# Patient Record
Sex: Female | Born: 1980 | Race: White | Hispanic: No | Marital: Married | Smoking: Never smoker
Health system: Southern US, Community
[De-identification: ages and names within clinical notes are randomized; demographics above are authoritative.]

## PROBLEM LIST (undated history)

## (undated) DIAGNOSIS — L732 Hidradenitis suppurativa: Secondary | ICD-10-CM

## (undated) DIAGNOSIS — B029 Zoster without complications: Secondary | ICD-10-CM

## (undated) DIAGNOSIS — M419 Scoliosis, unspecified: Secondary | ICD-10-CM

## (undated) DIAGNOSIS — L709 Acne, unspecified: Secondary | ICD-10-CM

## (undated) DIAGNOSIS — Z8619 Personal history of other infectious and parasitic diseases: Secondary | ICD-10-CM

## (undated) HISTORY — DX: Zoster without complications: B02.9

## (undated) HISTORY — PX: MANDIBLE SURGERY: SHX707

## (undated) HISTORY — DX: Hidradenitis suppurativa: L73.2

## (undated) HISTORY — PX: OTHER SURGICAL HISTORY: SHX169

## (undated) HISTORY — PX: ADENOIDECTOMY: SUR15

## (undated) HISTORY — DX: Personal history of other infectious and parasitic diseases: Z86.19

## (undated) HISTORY — PX: TONSILLECTOMY: SUR1361

## (undated) HISTORY — PX: TONSILLECTOMY AND ADENOIDECTOMY: SHX28

---

## 2007-02-08 ENCOUNTER — Emergency Department (HOSPITAL_COMMUNITY): Admission: EM | Admit: 2007-02-08 | Discharge: 2007-02-08 | Payer: Self-pay | Admitting: Emergency Medicine

## 2007-06-11 ENCOUNTER — Other Ambulatory Visit: Admission: RE | Admit: 2007-06-11 | Discharge: 2007-06-11 | Payer: Self-pay | Admitting: Obstetrics and Gynecology

## 2008-04-22 ENCOUNTER — Ambulatory Visit: Payer: Self-pay | Admitting: Sports Medicine

## 2008-04-22 DIAGNOSIS — M2140 Flat foot [pes planus] (acquired), unspecified foot: Secondary | ICD-10-CM | POA: Insufficient documentation

## 2008-04-22 DIAGNOSIS — IMO0002 Reserved for concepts with insufficient information to code with codable children: Secondary | ICD-10-CM | POA: Insufficient documentation

## 2008-04-22 DIAGNOSIS — M4120 Other idiopathic scoliosis, site unspecified: Secondary | ICD-10-CM | POA: Insufficient documentation

## 2008-04-23 ENCOUNTER — Encounter: Payer: Self-pay | Admitting: Sports Medicine

## 2009-01-20 ENCOUNTER — Ambulatory Visit: Payer: Self-pay | Admitting: Sports Medicine

## 2009-01-20 DIAGNOSIS — M659 Synovitis and tenosynovitis, unspecified: Secondary | ICD-10-CM | POA: Insufficient documentation

## 2009-01-20 DIAGNOSIS — M25579 Pain in unspecified ankle and joints of unspecified foot: Secondary | ICD-10-CM | POA: Insufficient documentation

## 2009-04-29 ENCOUNTER — Inpatient Hospital Stay (HOSPITAL_COMMUNITY): Admission: AD | Admit: 2009-04-29 | Discharge: 2009-05-19 | Payer: Self-pay | Admitting: Obstetrics and Gynecology

## 2009-05-01 ENCOUNTER — Encounter: Payer: Self-pay | Admitting: Obstetrics and Gynecology

## 2009-05-15 ENCOUNTER — Encounter (INDEPENDENT_AMBULATORY_CARE_PROVIDER_SITE_OTHER): Payer: Self-pay | Admitting: Obstetrics and Gynecology

## 2009-08-02 ENCOUNTER — Emergency Department (HOSPITAL_COMMUNITY): Admission: EM | Admit: 2009-08-02 | Discharge: 2009-08-02 | Payer: Self-pay | Admitting: Emergency Medicine

## 2010-09-19 IMAGING — US US FETAL BPP W/O NONSTRESS
1 series · 14 of 22 positions shown · non-contrast
Comparison: none

OBSTETRICAL ULTRASOUND:
 This ultrasound exam was performed in the [HOSPITAL] Ultrasound Department.  The OB US report was generated in the AS system, and faxed to the ordering physician.  This report is also available in [HOSPITAL]?s AccessANYware and in [REDACTED] PACS.

[Series 1: us fetal bpp w/o nonstress · non-contrast · 0.27mm/px · 22 acquisitions, 14 frames shown]
[im 1/22]
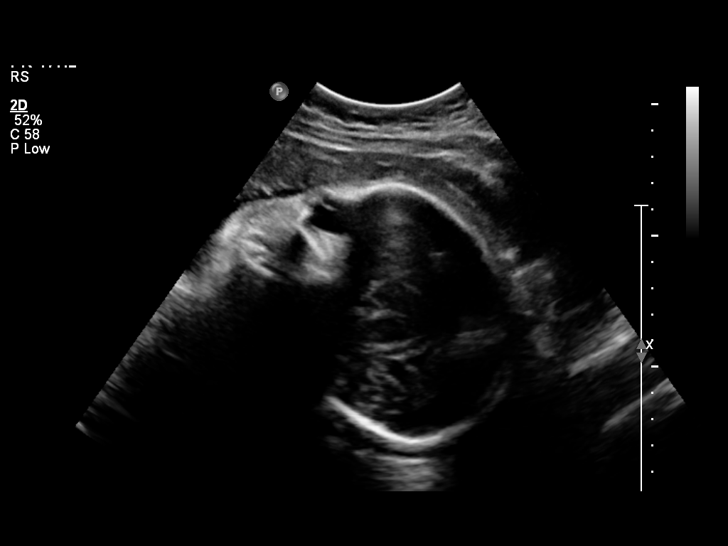
[im 3/22]
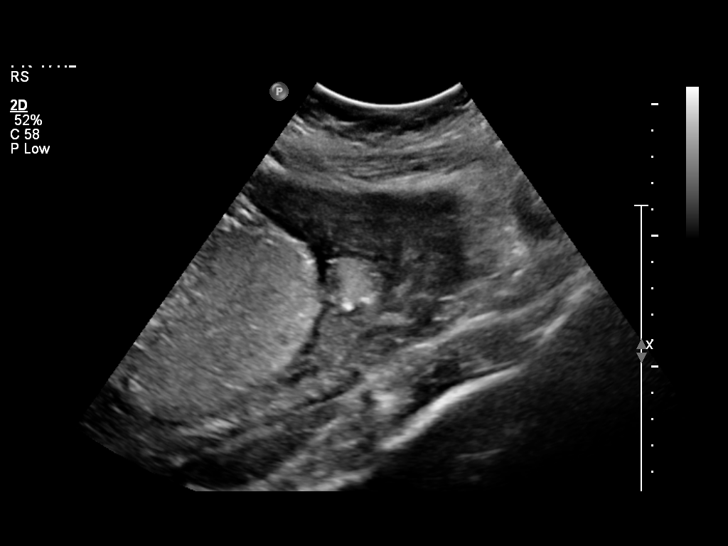
[im 4/22]
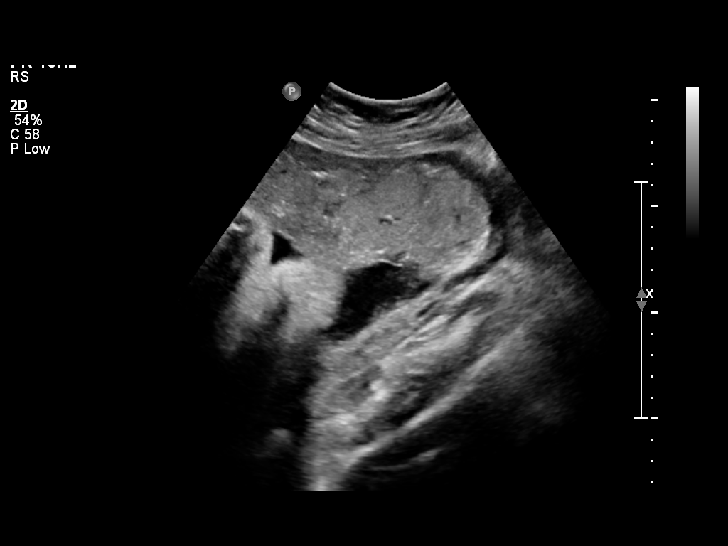
[im 6/22]
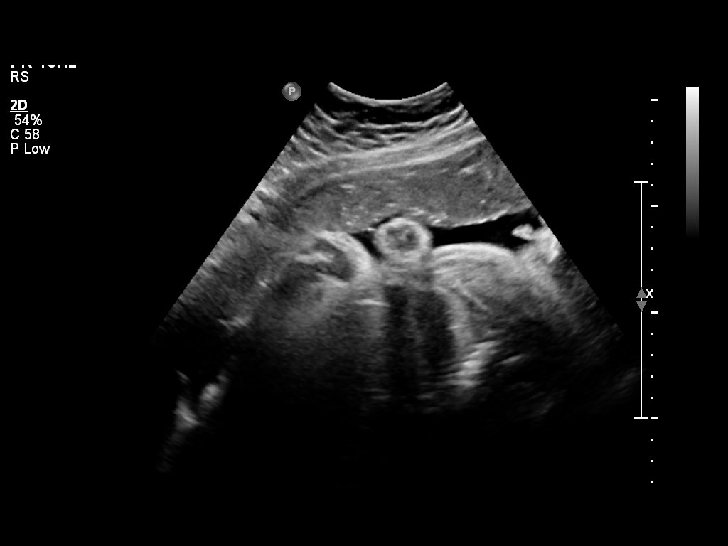
[im 8/22]
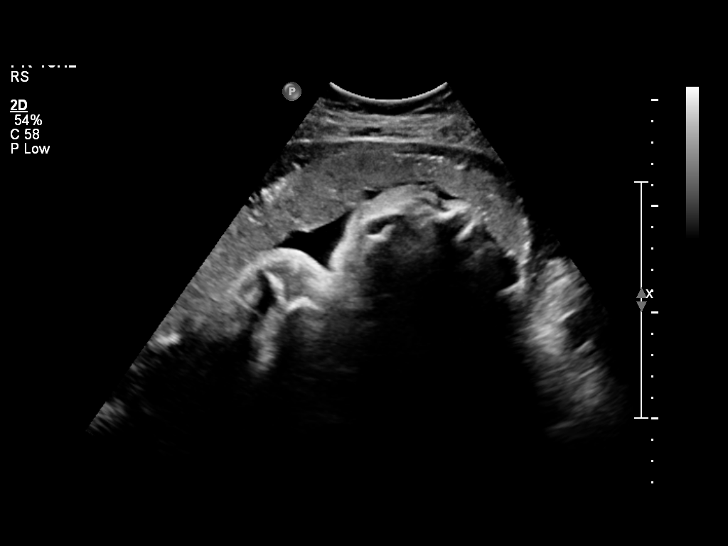
[im 9/22]
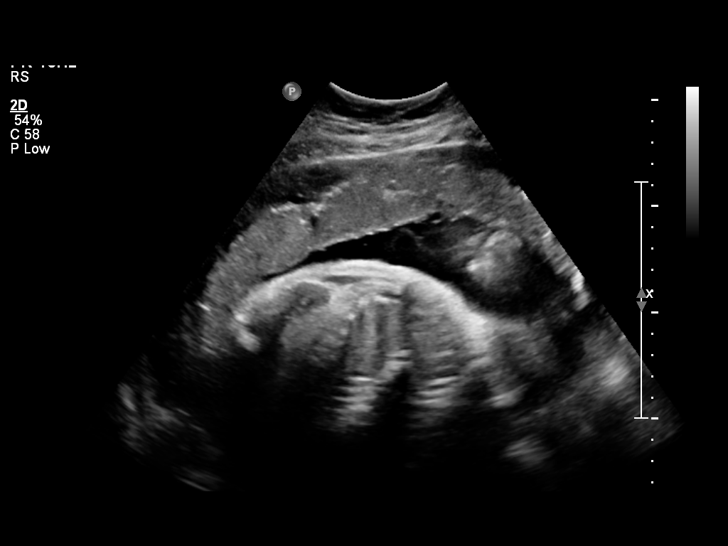
[im 11/22]
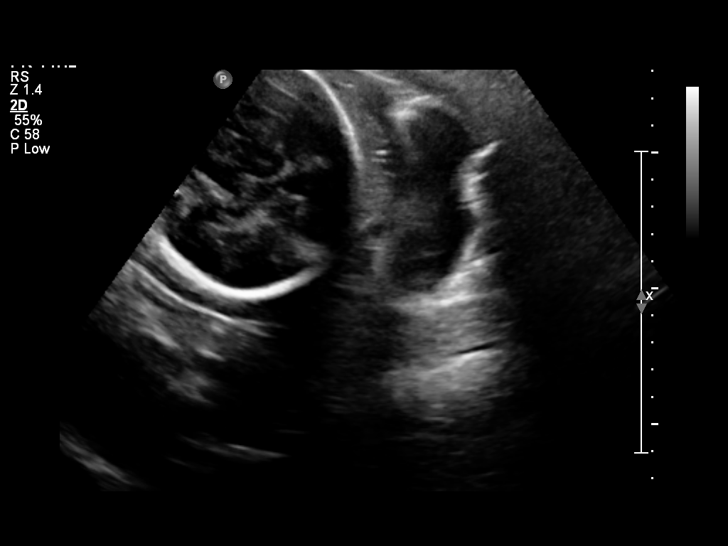
[im 12/22]
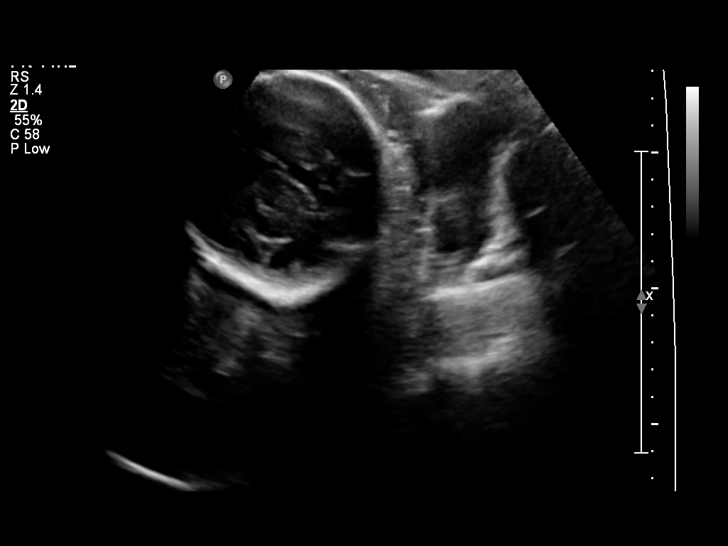
[im 14/22]
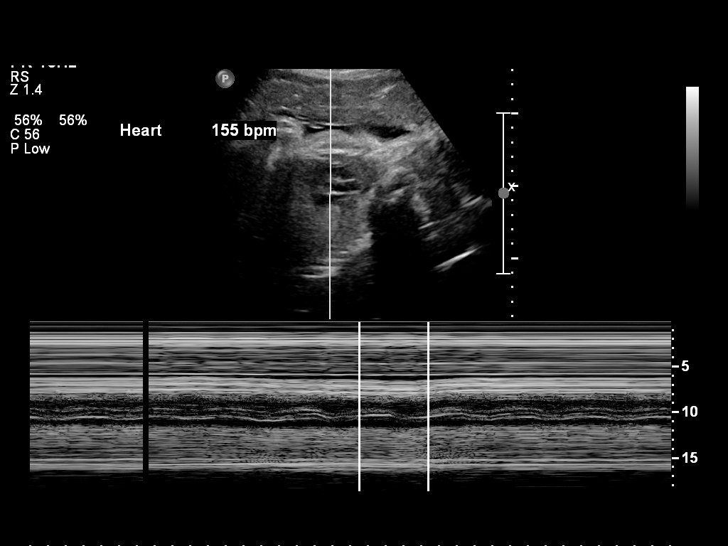
[im 15/22]
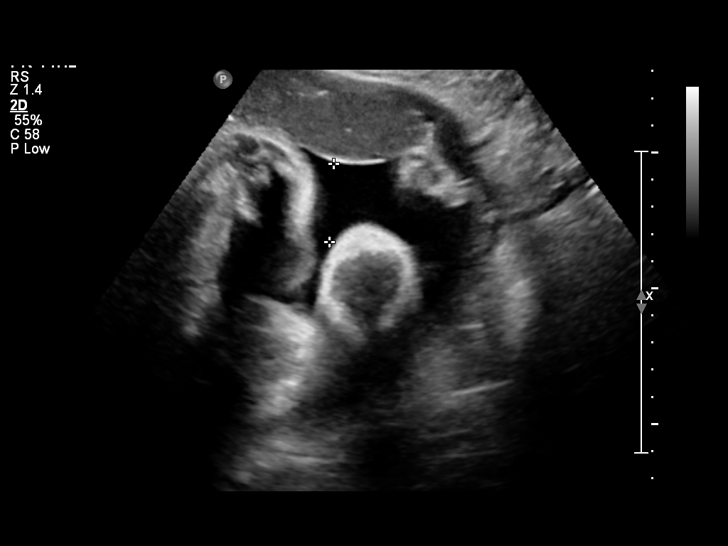
[im 17/22]
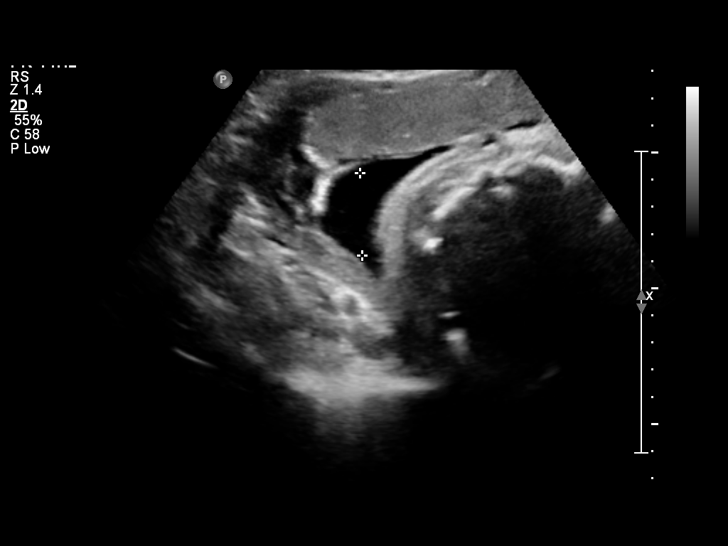
[im 19/22]
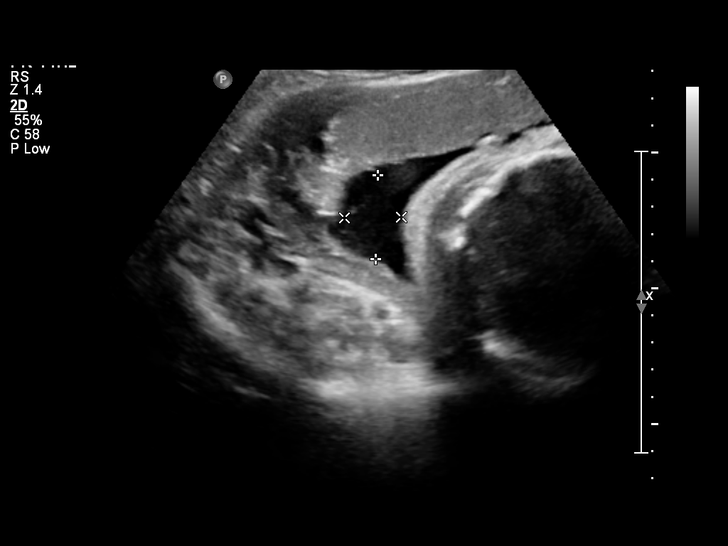
[im 20/22]
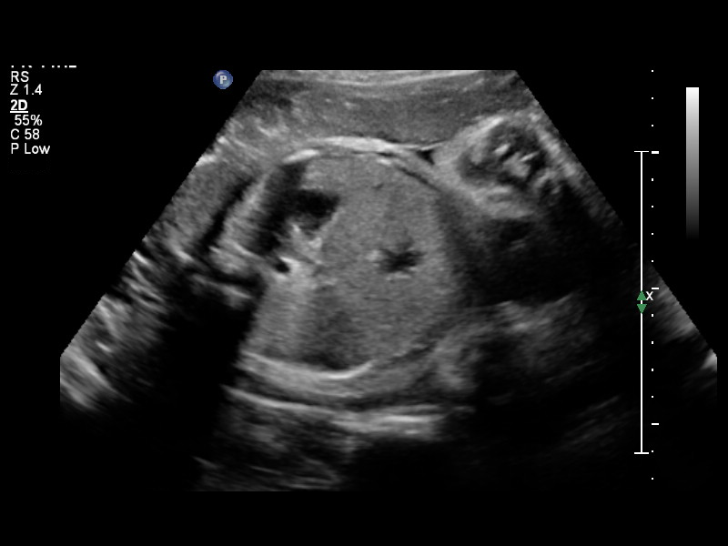
[im 22/22]
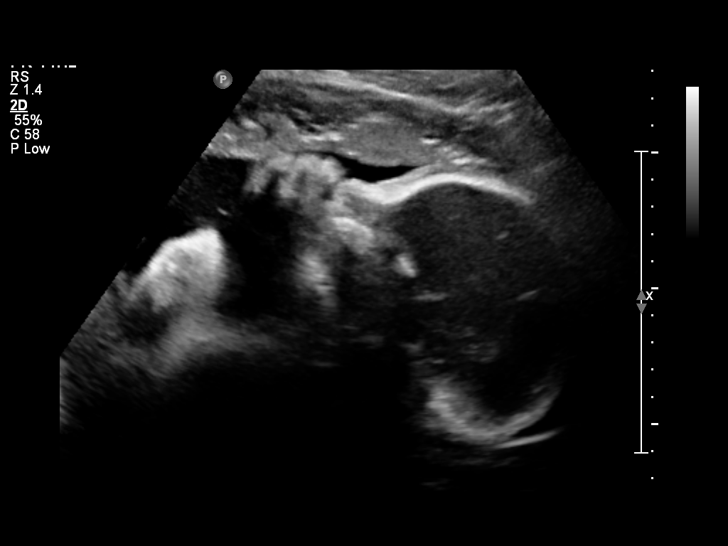

[14 of 22 positions shown; findings below may reference images not displayed]

IMPRESSION: See AS Obstetric US report.

## 2010-09-21 IMAGING — US US OB TRANSVAGINAL
1 series · 14 of 28 positions shown · non-contrast
Comparison: none

OBSTETRICAL ULTRASOUND:
 This ultrasound was performed in The [HOSPITAL], and the AS OB/GYN report will be stored to [REDACTED] PACS.  This report is also available in [HOSPITAL]?s accessANYware.

[Series 1: us ob transvaginal · 44 acquisitions, 14 frames shown]
[im 2/44]
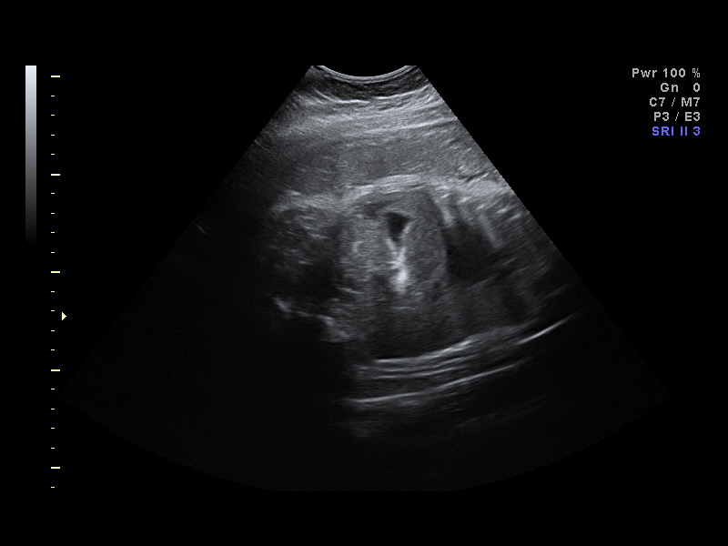
[im 5/44]
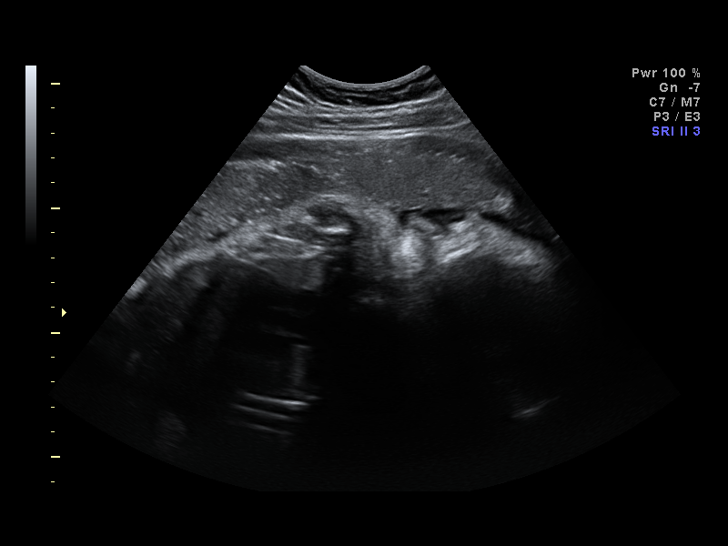
[im 8/44]
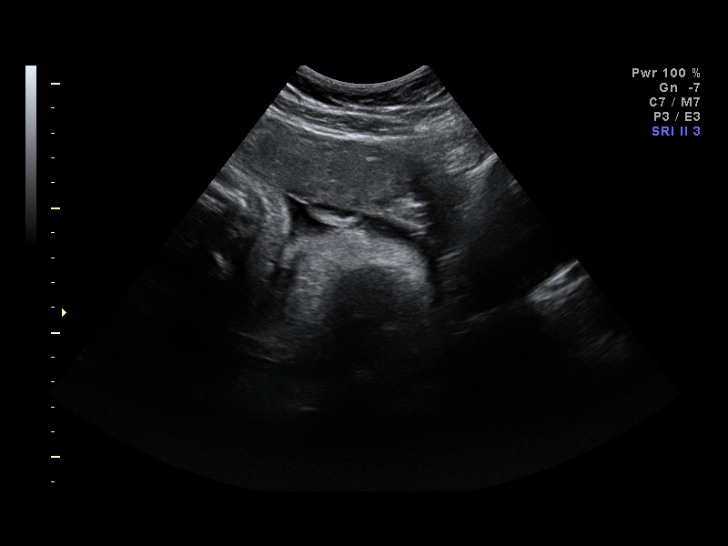
[im 12/44]
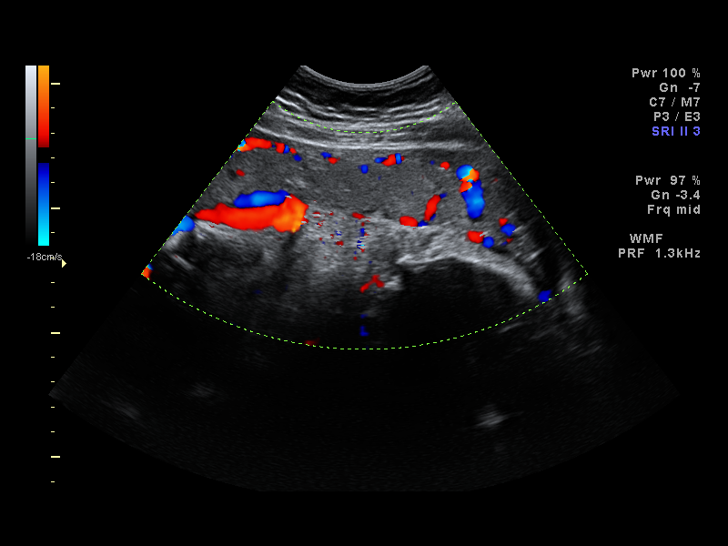
[im 15/44]
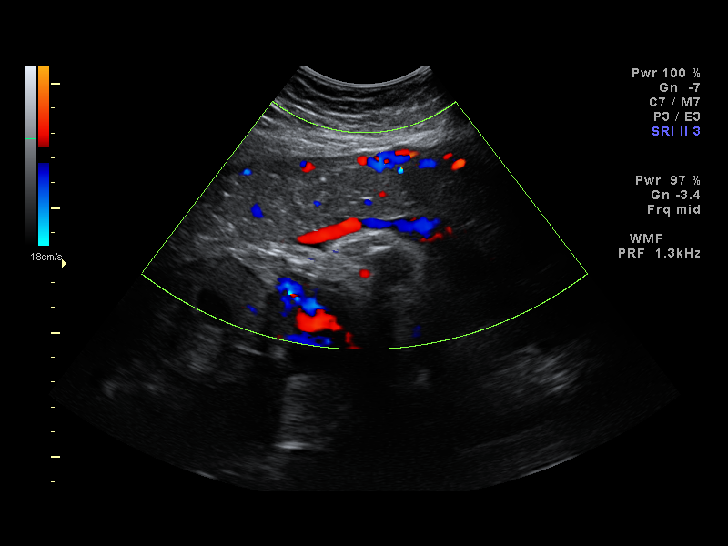
[im 18/44]
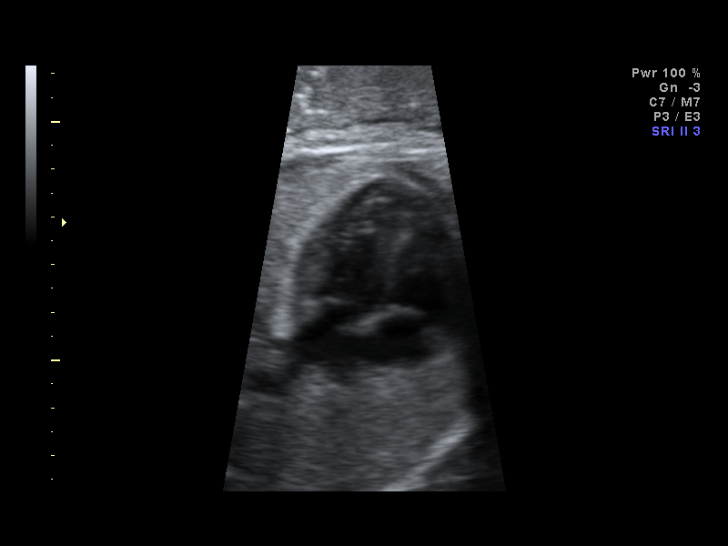
[im 21/44]
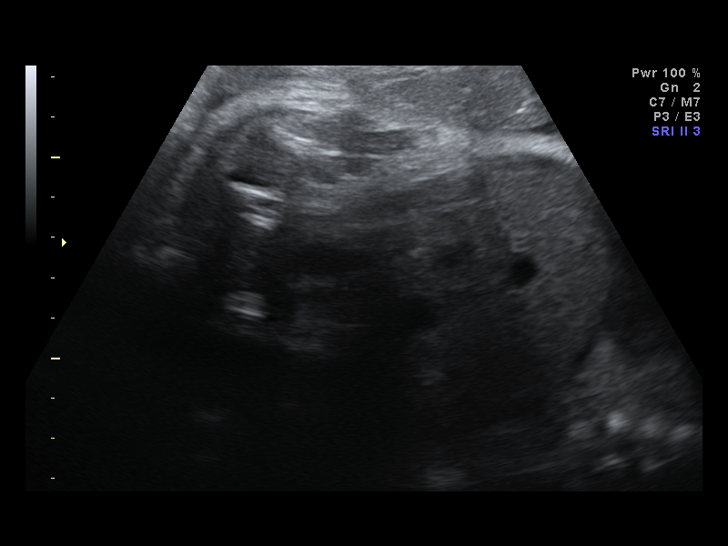
[im 24/44]
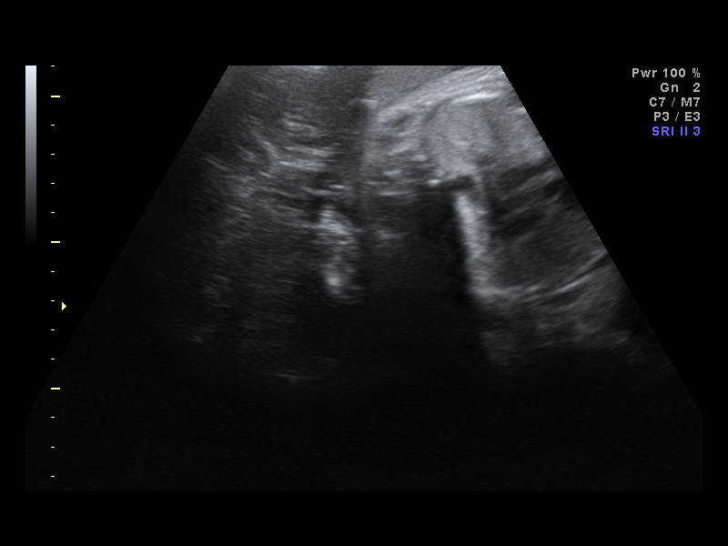
[im 28/44]
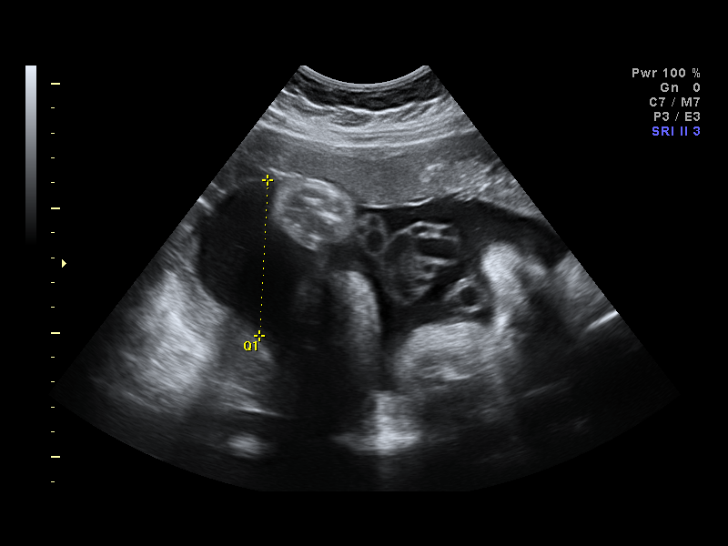
[im 31/44]
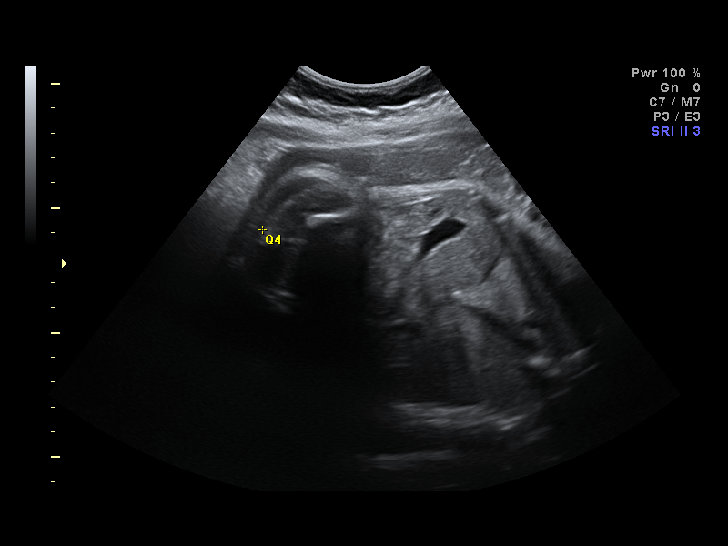
[im 34/44]
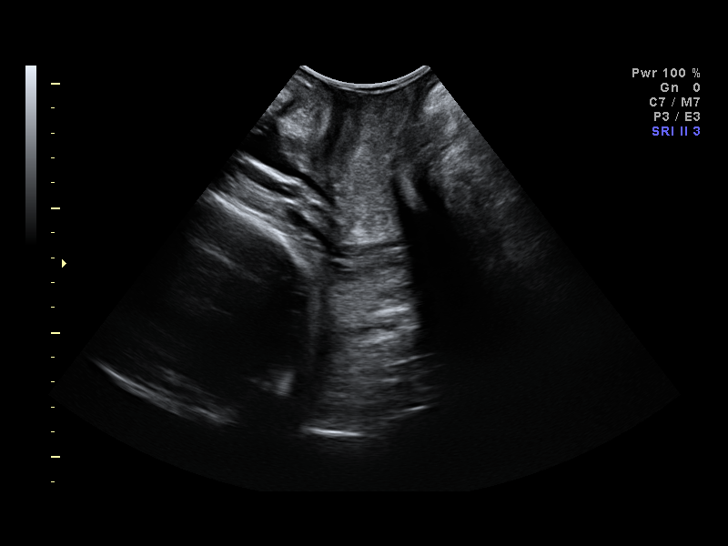
[im 37/44]
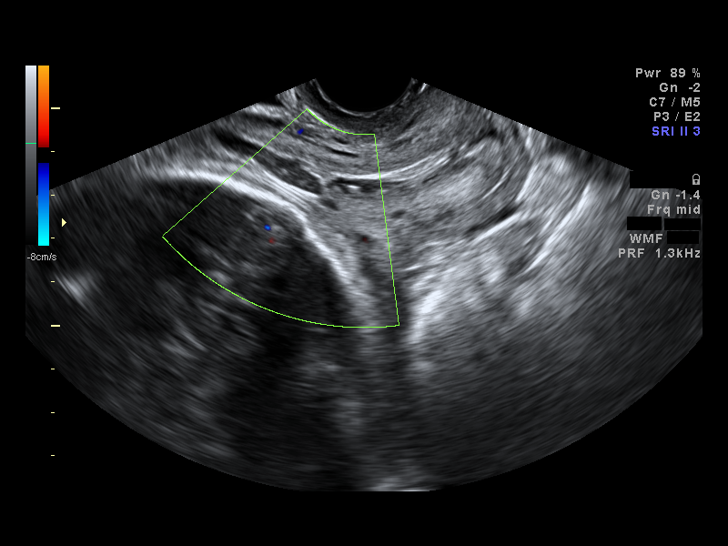
[im 40/44]
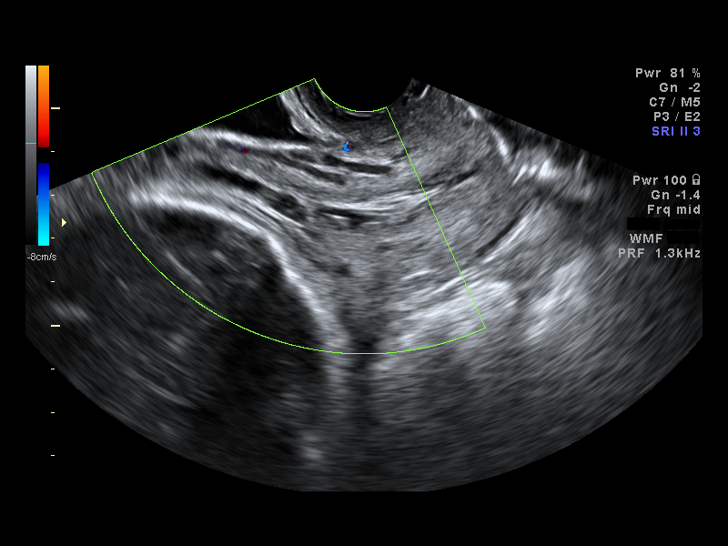
[im 44/44]
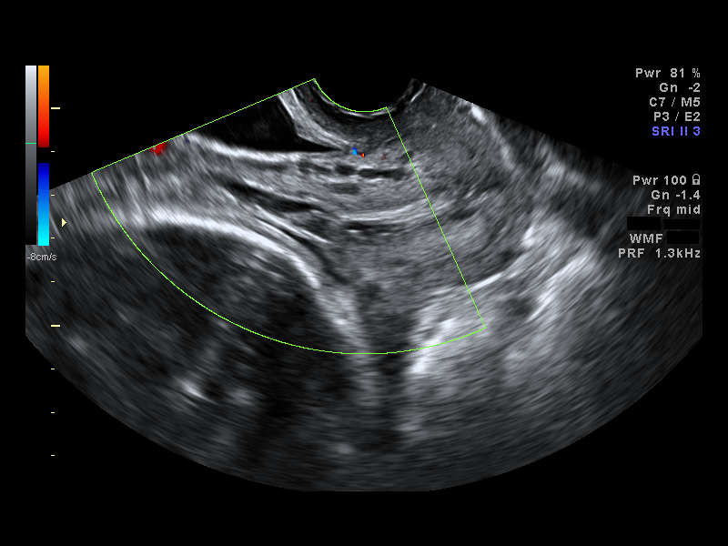

[14 of 28 positions shown; findings below may reference images not displayed]

IMPRESSION: AS OB/GYN has also been faxed to the ordering physician.

## 2010-09-23 ENCOUNTER — Encounter: Payer: Self-pay | Admitting: *Deleted

## 2010-10-13 LAB — CBC
HCT: 31.8 % — ABNORMAL LOW (ref 36.0–46.0)
HCT: 34.2 % — ABNORMAL LOW (ref 36.0–46.0)
HCT: 34.3 % — ABNORMAL LOW (ref 36.0–46.0)
Hemoglobin: 11.9 g/dL — ABNORMAL LOW (ref 12.0–15.0)
Hemoglobin: 12.2 g/dL (ref 12.0–15.0)
MCHC: 34.5 g/dL (ref 30.0–36.0)
MCHC: 34.5 g/dL (ref 30.0–36.0)
Platelets: 210 10*3/uL (ref 150–400)
Platelets: 213 10*3/uL (ref 150–400)
Platelets: 224 10*3/uL (ref 150–400)
RBC: 3.45 MIL/uL — ABNORMAL LOW (ref 3.87–5.11)
RBC: 3.74 MIL/uL — ABNORMAL LOW (ref 3.87–5.11)
RDW: 13.7 % (ref 11.5–15.5)
RDW: 13.7 % (ref 11.5–15.5)
RDW: 13.7 % (ref 11.5–15.5)
RDW: 13.9 % (ref 11.5–15.5)
WBC: 7.5 10*3/uL (ref 4.0–10.5)
WBC: 7.9 10*3/uL (ref 4.0–10.5)
WBC: 8.6 10*3/uL (ref 4.0–10.5)
WBC: 9.7 10*3/uL (ref 4.0–10.5)

## 2010-10-13 LAB — SAMPLE TO BLOOD BANK

## 2010-10-13 LAB — TYPE AND SCREEN: ABO/RH(D): A POS

## 2010-10-14 LAB — CBC
HCT: 34.3 % — ABNORMAL LOW (ref 36.0–46.0)
HCT: 35.4 % — ABNORMAL LOW (ref 36.0–46.0)
HCT: 35.5 % — ABNORMAL LOW (ref 36.0–46.0)
HCT: 37.1 % (ref 36.0–46.0)
Hemoglobin: 11.8 g/dL — ABNORMAL LOW (ref 12.0–15.0)
Hemoglobin: 12 g/dL (ref 12.0–15.0)
Hemoglobin: 12.1 g/dL (ref 12.0–15.0)
Hemoglobin: 12.6 g/dL (ref 12.0–15.0)
MCHC: 34 g/dL (ref 30.0–36.0)
MCV: 92.2 fL (ref 78.0–100.0)
MCV: 92.7 fL (ref 78.0–100.0)
Platelets: 228 K/uL (ref 150–400)
Platelets: 246 10*3/uL (ref 150–400)
RBC: 3.72 MIL/uL — ABNORMAL LOW (ref 3.87–5.11)
RBC: 3.83 MIL/uL — ABNORMAL LOW (ref 3.87–5.11)
RBC: 4.01 MIL/uL (ref 3.87–5.11)
RDW: 13.9 % (ref 11.5–15.5)
RDW: 14.4 % (ref 11.5–15.5)
WBC: 7.8 10*3/uL (ref 4.0–10.5)
WBC: 8.3 K/uL (ref 4.0–10.5)

## 2010-10-14 LAB — COMPREHENSIVE METABOLIC PANEL
ALT: 26 U/L (ref 0–35)
Albumin: 2.8 g/dL — ABNORMAL LOW (ref 3.5–5.2)
CO2: 22 mEq/L (ref 19–32)
Chloride: 105 mEq/L (ref 96–112)
Creatinine, Ser: 0.61 mg/dL (ref 0.4–1.2)
Potassium: 3.4 mEq/L — ABNORMAL LOW (ref 3.5–5.1)
Sodium: 134 mEq/L — ABNORMAL LOW (ref 135–145)

## 2010-10-14 LAB — TYPE AND SCREEN
ABO/RH(D): A POS
Antibody Screen: NEGATIVE

## 2010-10-14 LAB — RPR: RPR Ser Ql: NONREACTIVE

## 2010-10-14 LAB — GLUCOSE, CAPILLARY
Glucose-Capillary: 111 mg/dL — ABNORMAL HIGH (ref 70–99)
Glucose-Capillary: 84 mg/dL (ref 70–99)

## 2010-10-14 LAB — LACTATE DEHYDROGENASE: LDH: 128 U/L (ref 94–250)

## 2010-10-14 LAB — SAMPLE TO BLOOD BANK

## 2010-10-14 LAB — ABO/RH: ABO/RH(D): A POS

## 2010-10-14 LAB — URIC ACID: Uric Acid, Serum: 3.3 mg/dL (ref 2.4–7.0)

## 2011-10-18 ENCOUNTER — Encounter (HOSPITAL_COMMUNITY): Payer: Self-pay | Admitting: Pharmacist

## 2011-11-01 ENCOUNTER — Encounter (HOSPITAL_COMMUNITY): Payer: Self-pay

## 2011-11-01 ENCOUNTER — Encounter (HOSPITAL_COMMUNITY)
Admission: RE | Admit: 2011-11-01 | Discharge: 2011-11-01 | Disposition: A | Discharge status: Home / Self Care | Payer: 59 | Source: Ambulatory Visit | Attending: Obstetrics and Gynecology | Admitting: Obstetrics and Gynecology

## 2011-11-01 HISTORY — DX: Acne, unspecified: L70.9

## 2011-11-01 LAB — CBC
HCT: 38.8 % (ref 36.0–46.0)
Hemoglobin: 13.2 g/dL (ref 12.0–15.0)
MCH: 30 pg (ref 26.0–34.0)
MCHC: 34 g/dL (ref 30.0–36.0)
MCV: 88.2 fL (ref 78.0–100.0)
RDW: 13 % (ref 11.5–15.5)

## 2011-11-01 NOTE — Patient Instructions (Addendum)
   Your procedure is scheduled on: Tuesday, April 30th   Enter through the [employer] [facility] at: [employer] up the phone at the desk and dial [phone] and inform us of your arrival.  Please call this number if you have any problems the morning of surgery: [phone]  Remember: Do not eat food after midnight: Monday Do not drink clear liquids after: Monday Take these medicines the morning of surgery with a SIP OF WATER: None  Do not wear jewelry, make-up, or FINGER nail polish Do not wear lotions, powders, perfumes or deodorant. Do not shave 48 hours prior to surgery. Do not bring valuables to the hospital. Contacts, dentures or bridgework may not be worn into surgery.  Patients discharged on the day of surgery will not be allowed to drive home.  Home with [facility] cell [phone]     Remember to use your hibiclens as instructed.Please shower with 1/2 bottle the evening before your surgery and the other 1/2 bottle the morning of surgery. Neck down avoiding private area.

## 2011-11-07 MED ORDER — DEXTROSE 5 % IV SOLN
2.0000 g | INTRAVENOUS | Status: AC
  Administered 2011-11-08: 2 g via INTRAVENOUS
  Filled 2011-11-07: qty 2

## 2011-11-08 ENCOUNTER — Encounter (HOSPITAL_COMMUNITY)
Admission: RE | Disposition: A | Discharge status: Home / Self Care | Payer: Self-pay | Source: Ambulatory Visit | Attending: Obstetrics and Gynecology

## 2011-11-08 ENCOUNTER — Encounter (HOSPITAL_COMMUNITY): Payer: Self-pay | Admitting: *Deleted

## 2011-11-08 ENCOUNTER — Encounter (HOSPITAL_COMMUNITY): Payer: Self-pay | Admitting: Anesthesiology

## 2011-11-08 ENCOUNTER — Ambulatory Visit (HOSPITAL_COMMUNITY)
Admission: RE | Admit: 2011-11-08 | Discharge: 2011-11-09 | Disposition: A | Discharge status: Home / Self Care | Payer: 59 | Source: Ambulatory Visit | Attending: Obstetrics and Gynecology | Admitting: Obstetrics and Gynecology

## 2011-11-08 ENCOUNTER — Ambulatory Visit (HOSPITAL_COMMUNITY): Payer: 59 | Admitting: Anesthesiology

## 2011-11-08 DIAGNOSIS — D279 Benign neoplasm of unspecified ovary: Secondary | ICD-10-CM | POA: Insufficient documentation

## 2011-11-08 DIAGNOSIS — Z5331 Laparoscopic surgical procedure converted to open procedure: Secondary | ICD-10-CM | POA: Insufficient documentation

## 2011-11-08 DIAGNOSIS — N80109 Endometriosis of ovary, unspecified side, unspecified depth: Secondary | ICD-10-CM | POA: Insufficient documentation

## 2011-11-08 DIAGNOSIS — N801 Endometriosis of ovary: Secondary | ICD-10-CM | POA: Insufficient documentation

## 2011-11-08 DIAGNOSIS — Z01818 Encounter for other preprocedural examination: Secondary | ICD-10-CM | POA: Insufficient documentation

## 2011-11-08 DIAGNOSIS — Z01812 Encounter for preprocedural laboratory examination: Secondary | ICD-10-CM | POA: Insufficient documentation

## 2011-11-08 HISTORY — PX: LAPAROSCOPY: SHX197

## 2011-11-08 HISTORY — PX: LAPAROTOMY: SHX154

## 2011-11-08 HISTORY — PX: OVARIAN CYST REMOVAL: SHX89

## 2011-11-08 LAB — URINALYSIS, ROUTINE W REFLEX MICROSCOPIC
Bilirubin Urine: NEGATIVE
Glucose, UA: NEGATIVE mg/dL
Ketones, ur: NEGATIVE mg/dL
Specific Gravity, Urine: 1.015 (ref 1.005–1.030)
pH: 8 (ref 5.0–8.0)

## 2011-11-08 SURGERY — LAPAROSCOPY, DIAGNOSTIC
Anesthesia: General | Site: Abdomen | Laterality: Right | Wound class: Clean

## 2011-11-08 MED ORDER — DEXTROSE IN LACTATED RINGERS 5 % IV SOLN
INTRAVENOUS | Status: DC
  Administered 2011-11-08: 18:00:00 via INTRAVENOUS

## 2011-11-08 MED ORDER — ONDANSETRON HCL 4 MG/2ML IJ SOLN
INTRAMUSCULAR | Status: AC
  Filled 2011-11-08: qty 2

## 2011-11-08 MED ORDER — GLYCOPYRROLATE 0.2 MG/ML IJ SOLN
INTRAMUSCULAR | Status: DC | PRN
  Administered 2011-11-08: 0.4 mg via INTRAVENOUS

## 2011-11-08 MED ORDER — LACTATED RINGERS IV SOLN
INTRAVENOUS | Status: DC
  Administered 2011-11-08: 125 mL/h via INTRAVENOUS
  Administered 2011-11-08 (×2): via INTRAVENOUS

## 2011-11-08 MED ORDER — FENTANYL CITRATE 0.05 MG/ML IJ SOLN
25.0000 ug | INTRAMUSCULAR | Status: DC | PRN
  Administered 2011-11-08 (×2): 50 ug via INTRAVENOUS

## 2011-11-08 MED ORDER — FENTANYL CITRATE 0.05 MG/ML IJ SOLN
INTRAMUSCULAR | Status: AC
  Filled 2011-11-08: qty 2

## 2011-11-08 MED ORDER — NEOSTIGMINE METHYLSULFATE 1 MG/ML IJ SOLN
INTRAMUSCULAR | Status: DC | PRN
  Administered 2011-11-08: 3 mg via INTRAVENOUS

## 2011-11-08 MED ORDER — BUPIVACAINE HCL (PF) 0.25 % IJ SOLN
INTRAMUSCULAR | Status: DC | PRN
  Administered 2011-11-08: 4 mL

## 2011-11-08 MED ORDER — LIDOCAINE HCL (CARDIAC) 20 MG/ML IV SOLN
INTRAVENOUS | Status: AC
  Filled 2011-11-08: qty 5

## 2011-11-08 MED ORDER — KETOROLAC TROMETHAMINE 30 MG/ML IJ SOLN
INTRAMUSCULAR | Status: DC | PRN
  Administered 2011-11-08: 30 mg via INTRAVENOUS

## 2011-11-08 MED ORDER — HYDROMORPHONE HCL PF 1 MG/ML IJ SOLN
1.0000 mg | INTRAMUSCULAR | Status: DC | PRN
  Administered 2011-11-08: 1 mg via INTRAVENOUS
  Filled 2011-11-08: qty 1

## 2011-11-08 MED ORDER — OXYCODONE-ACETAMINOPHEN 5-325 MG PO TABS: 1.0000 | ORAL_TABLET | ORAL | Status: DC | PRN

## 2011-11-08 MED ORDER — MEPERIDINE HCL 25 MG/ML IJ SOLN
6.2500 mg | INTRAMUSCULAR | Status: DC | PRN
Start: 2011-11-08 — End: 2011-11-08

## 2011-11-08 MED ORDER — MENTHOL 3 MG MT LOZG: 1.0000 | LOZENGE | OROMUCOSAL | Status: DC | PRN

## 2011-11-08 MED ORDER — NORGESTIM-ETH ESTRAD TRIPHASIC 0.18/0.215/0.25 MG-25 MCG PO TABS
1.0000 | ORAL_TABLET | Freq: Every day | ORAL | Status: DC
  Administered 2011-11-08: 1 via ORAL

## 2011-11-08 MED ORDER — 0.9 % SODIUM CHLORIDE (POUR BTL) OPTIME
TOPICAL | Status: DC | PRN
  Administered 2011-11-08: 1000 mL

## 2011-11-08 MED ORDER — FENTANYL CITRATE 0.05 MG/ML IJ SOLN
INTRAMUSCULAR | Status: DC | PRN
  Administered 2011-11-08 (×2): 100 ug via INTRAVENOUS
  Administered 2011-11-08: 150 ug via INTRAVENOUS

## 2011-11-08 MED ORDER — HYDROMORPHONE HCL PF 1 MG/ML IJ SOLN
INTRAMUSCULAR | Status: DC | PRN
  Administered 2011-11-08: 1 mg via INTRAVENOUS

## 2011-11-08 MED ORDER — PROMETHAZINE HCL 25 MG/ML IJ SOLN: 6.2500 mg | INTRAMUSCULAR | Status: DC | PRN

## 2011-11-08 MED ORDER — ONDANSETRON HCL 4 MG PO TABS: 4.0000 mg | ORAL_TABLET | Freq: Four times a day (QID) | ORAL | Status: DC | PRN

## 2011-11-08 MED ORDER — MIDAZOLAM HCL 2 MG/2ML IJ SOLN: 0.5000 mg | Freq: Once | INTRAMUSCULAR | Status: DC | PRN

## 2011-11-08 MED ORDER — KETOROLAC TROMETHAMINE 30 MG/ML IJ SOLN
30.0000 mg | Freq: Four times a day (QID) | INTRAMUSCULAR | Status: AC
  Administered 2011-11-08 – 2011-11-09 (×2): 30 mg via INTRAVENOUS
  Filled 2011-11-08 (×2): qty 1

## 2011-11-08 MED ORDER — FENTANYL CITRATE 0.05 MG/ML IJ SOLN
INTRAMUSCULAR | Status: AC
  Filled 2011-11-08: qty 5

## 2011-11-08 MED ORDER — ACETAMINOPHEN 325 MG PO TABS: 325.0000 mg | ORAL_TABLET | ORAL | Status: DC | PRN

## 2011-11-08 MED ORDER — BUPIVACAINE HCL (PF) 0.25 % IJ SOLN
INTRAMUSCULAR | Status: AC
  Filled 2011-11-08: qty 30

## 2011-11-08 MED ORDER — HYDROMORPHONE HCL PF 1 MG/ML IJ SOLN
INTRAMUSCULAR | Status: AC
  Filled 2011-11-08: qty 1

## 2011-11-08 MED ORDER — MIDAZOLAM HCL 2 MG/2ML IJ SOLN
INTRAMUSCULAR | Status: AC
  Filled 2011-11-08: qty 2

## 2011-11-08 MED ORDER — ADULT MULTIVITAMIN W/MINERALS CH
1.0000 | ORAL_TABLET | Freq: Every day | ORAL | Status: DC
  Administered 2011-11-08 – 2011-11-09 (×2): 1 via ORAL
  Filled 2011-11-08 (×2): qty 1

## 2011-11-08 MED ORDER — LIDOCAINE HCL (CARDIAC) 20 MG/ML IV SOLN
INTRAVENOUS | Status: DC | PRN
  Administered 2011-11-08: 50 mg via INTRAVENOUS

## 2011-11-08 MED ORDER — DOXYCYCLINE HYCLATE 100 MG PO TABS
100.0000 mg | ORAL_TABLET | Freq: Every day | ORAL | Status: DC
  Administered 2011-11-08 – 2011-11-09 (×2): 100 mg via ORAL
  Filled 2011-11-08 (×2): qty 1

## 2011-11-08 MED ORDER — ONDANSETRON HCL 4 MG/2ML IJ SOLN: 4.0000 mg | Freq: Four times a day (QID) | INTRAMUSCULAR | Status: DC | PRN

## 2011-11-08 MED ORDER — PROPOFOL 10 MG/ML IV EMUL
INTRAVENOUS | Status: AC
  Filled 2011-11-08: qty 20

## 2011-11-08 MED ORDER — DEXAMETHASONE SODIUM PHOSPHATE 4 MG/ML IJ SOLN
INTRAMUSCULAR | Status: DC | PRN
  Administered 2011-11-08: 8 mg via INTRAVENOUS

## 2011-11-08 MED ORDER — ROCURONIUM BROMIDE 100 MG/10ML IV SOLN
INTRAVENOUS | Status: DC | PRN
  Administered 2011-11-08 (×2): 10 mg via INTRAVENOUS
  Administered 2011-11-08: 30 mg via INTRAVENOUS

## 2011-11-08 MED ORDER — ONDANSETRON HCL 4 MG/2ML IJ SOLN
INTRAMUSCULAR | Status: DC | PRN
  Administered 2011-11-08: 4 mg via INTRAVENOUS

## 2011-11-08 MED ORDER — PROPOFOL 10 MG/ML IV EMUL
INTRAVENOUS | Status: DC | PRN
  Administered 2011-11-08: 160 mg via INTRAVENOUS

## 2011-11-08 MED ORDER — OXYCODONE-ACETAMINOPHEN 5-325 MG PO TABS
1.0000 | ORAL_TABLET | ORAL | Status: DC | PRN
  Administered 2011-11-08 – 2011-11-09 (×3): 2 via ORAL
  Filled 2011-11-08 (×3): qty 2

## 2011-11-08 MED ORDER — MIDAZOLAM HCL 5 MG/5ML IJ SOLN
INTRAMUSCULAR | Status: DC | PRN
  Administered 2011-11-08: 2 mg via INTRAVENOUS

## 2011-11-08 MED ORDER — KETOROLAC TROMETHAMINE 30 MG/ML IJ SOLN: 15.0000 mg | Freq: Once | INTRAMUSCULAR | Status: DC | PRN

## 2011-11-08 SURGICAL SUPPLY — 40 items
BARRIER ADHS 3X4 INTERCEED (GAUZE/BANDAGES/DRESSINGS) IMPLANT
CABLE HIGH FREQUENCY MONO STRZ (ELECTRODE) IMPLANT
CATH ROBINSON RED A/P 16FR (CATHETERS) ×5 IMPLANT
CHLORAPREP W/TINT 26ML (MISCELLANEOUS) ×10 IMPLANT
CLOTH BEACON ORANGE TIMEOUT ST (SAFETY) ×5 IMPLANT
COVER MAYO STAND STRL (DRAPES) ×5 IMPLANT
DERMABOND ADVANCED (GAUZE/BANDAGES/DRESSINGS) ×1
DERMABOND ADVANCED .7 DNX12 (GAUZE/BANDAGES/DRESSINGS) ×4 IMPLANT
EXTRACTOR VACUUM M CUP 4 TUBE (SUCTIONS) ×5 IMPLANT
GLOVE BIO SURGEON STRL SZ 6.5 (GLOVE) ×5 IMPLANT
GLOVE BIOGEL PI IND STRL 7.0 (GLOVE) ×8 IMPLANT
GLOVE BIOGEL PI INDICATOR 7.0 (GLOVE) ×2
GOWN PREVENTION PLUS LG XLONG (DISPOSABLE) ×10 IMPLANT
NEEDLE SPNL 18GX3.5 QUINCKE PK (NEEDLE) ×5 IMPLANT
NS IRRIG 1000ML POUR BTL (IV SOLUTION) ×5 IMPLANT
PACK LAPAROSCOPY BASIN (CUSTOM PROCEDURE TRAY) ×5 IMPLANT
POUCH SPECIMEN RETRIEVAL 10MM (ENDOMECHANICALS) IMPLANT
PROTECTOR NERVE ULNAR (MISCELLANEOUS) ×5 IMPLANT
SEALER TISSUE G2 CVD JAW 35 (ENDOMECHANICALS) IMPLANT
SEALER TISSUE G2 CVD JAW 45CM (ENDOMECHANICALS) IMPLANT
SET IRRIG TUBING LAPAROSCOPIC (IRRIGATION / IRRIGATOR) IMPLANT
SLEEVE ADV FIXATION 5X100MM (TROCAR) IMPLANT
SPONGE LAP 18X18 X RAY DECT (DISPOSABLE) ×10 IMPLANT
STRIP CLOSURE SKIN 1/2X4 (GAUZE/BANDAGES/DRESSINGS) IMPLANT
SUT MON AB-0 CT1 36 (SUTURE) ×5 IMPLANT
SUT PDS AB 0 CTX 60 (SUTURE) ×5 IMPLANT
SUT VIC AB 0 CT1 18XCR BRD8 (SUTURE) ×4 IMPLANT
SUT VIC AB 0 CT1 8-18 (SUTURE) ×1
SUT VIC AB 3-0 PS2 18 (SUTURE) ×1
SUT VIC AB 3-0 PS2 18XBRD (SUTURE) ×4 IMPLANT
SUT VICRYL 0 TIES 12 18 (SUTURE) ×5 IMPLANT
SUT VICRYL 0 UR6 27IN ABS (SUTURE) ×5 IMPLANT
SYR 30ML LL (SYRINGE) ×5 IMPLANT
SYR 50ML LL SCALE MARK (SYRINGE) ×5 IMPLANT
TOWEL OR 17X24 6PK STRL BLUE (TOWEL DISPOSABLE) ×10 IMPLANT
TROCAR Z-THREAD BLADED 5X100MM (TROCAR) ×5 IMPLANT
TROCAR Z-THREAD FIOS 11X100 BL (TROCAR) ×5 IMPLANT
WARMER LAPAROSCOPE (MISCELLANEOUS) ×5 IMPLANT
WATER STERILE IRR 1000ML POUR (IV SOLUTION) IMPLANT
YANKAUER SUCT BULB TIP NO VENT (SUCTIONS) ×5 IMPLANT

## 2011-11-08 NOTE — Transfer of Care (Signed)
Immediate Anesthesia Transfer of Care Note  Patient: [name]  Procedure(s) Performed: Procedure(s) (LRB): LAPAROSCOPY DIAGNOSTIC (N/A) LAPAROTOMY (N/A) OVARIAN CYSTECTOMY (Left)  Patient Location: PACU  Anesthesia Type: General  Level of Consciousness: awake, alert  and oriented  Airway & Oxygen Therapy: Patient Spontanous Breathing and Patient connected to nasal cannula oxygen  Post-op Assessment: Report given to PACU RN and Post -op Vital signs reviewed and stable  Post vital signs: Reviewed and stable  Complications: No apparent anesthesia complications

## 2011-11-08 NOTE — Anesthesia Preprocedure Evaluation (Addendum)
Anesthesia Evaluation  Patient identified by MRN, date of birth, ID band Patient awake    Reviewed: Allergy & Precautions, H&P , Patient's Chart, lab work & pertinent test results, reviewed documented beta blocker date and time   History of Anesthesia Complications Negative for: history of anesthetic complications  Airway Mallampati: II TM Distance: >3 FB Neck ROM: full    Dental No notable dental hx.    Pulmonary neg pulmonary ROS,  breath sounds clear to auscultation  Pulmonary exam normal       Cardiovascular Exercise Tolerance: Good negative cardio ROS  Rhythm:regular Rate:Normal     Neuro/Psych negative neurological ROS  negative psych ROS   GI/Hepatic negative GI ROS, Neg liver ROS,   Endo/Other  negative endocrine ROS  Renal/GU negative Renal ROS     Musculoskeletal   Abdominal   Peds  Hematology negative hematology ROS (+)   Anesthesia Other Findings S/p mandible surgery  Reproductive/Obstetrics negative OB ROS                          Anesthesia Physical Anesthesia Plan  ASA: II  Anesthesia Plan: General ETT   Post-op Pain Management:    Induction:   Airway Management Planned:   Additional Equipment:   Intra-op Plan:   Post-operative Plan:   Informed Consent: I have reviewed the patients History and Physical, chart, labs and discussed the procedure including the risks, benefits and alternatives for the proposed anesthesia with the patient or authorized representative who has indicated his/her understanding and acceptance.   Dental Advisory Given  Plan Discussed with: CRNA and Surgeon  Anesthesia Plan Comments:        Anesthesia Quick Evaluation

## 2011-11-08 NOTE — Op Note (Signed)
[name], [name] NO.:  [id number]  MEDICAL RECORD NO.:  [id number]  LOCATION:  [facility]                          FACILITY:  [facility]  PHYSICIAN:  Zelphia Cairo, MD    DATE OF BIRTH:  19-Mar-1981  DATE OF PROCEDURE:  11/08/2011 DATE OF DISCHARGE:                              OPERATIVE REPORT   PREOPERATIVE DIAGNOSIS:  Right adnexal mass.  POSTOPERATIVE DIAGNOSIS:  Left adnexal dermoid.  PROCEDURE:  Diagnostic laparoscopy, laparotomy with left salpingo- oophorectomy.  SURGEON:  Zelphia Cairo, MD.  ASSISTANT:  Duke Salvia. Marcelle Overlie, MD.  BLOOD LOSS:  50 mL.  ANESTHESIA:  General.  COMPLICATIONS:  None.  CONDITION:  Stable to recovery room.  PATHOLOGY:  Left adnexa.  PROCEDURE IN DETAIL:  The patient was taken to the operating room after informed consent was obtained.  She was given general anesthesia, placed in the dorsal lithotomy position using Allen stirrups.  She was prepped and draped in sterile fashion.  An in and out catheter was used to drain her bladder.  A small infraumbilical skin incision was made with a scalpel and extended bluntly to the level of the fascia using a Kelly clamp.  Optical trocar was inserted under direct visualization.  The patient was placed in Trendelenburg position.  Large adnexal mass was noted to be consuming the pelvis, uterus, and opposite adnexa could not be visualized due to the size of the mass.  Because of our concern for a dermoid, we did not want to drain the cyst.  Therefore, trocar and instruments were removed from the umbilicus.  A deep stitch was placed in the umbilical incision and the skin was closed with 4-0 Vicryl.  A Foley catheter was placed sterilely and a Pfannenstiel skin incision was made with a scalpel and extended bluntly to the level of the fascia. The fascia was incised in the midline.  This was extended laterally using curved Mayo scissors.  Fascia was incised in the midline and extended  laterally using curved Mayo scissors.  Peritoneum was identified and tented upwards, entered sharply with the scalpel and Metzenbaum scissors.  This was extended superiorly and inferiorly with good visualization of the bladder.  The ovarian mass was identified. Because of its size, a small incision had to be extended slightly, and we could not manipulate it through the incision.  The vacuum was then applied to the mass and the cyst was delivered through the incision. Normal ovarian tissue could not be identified.  The left fallopian tube was consumed in the mass.  Curved Heaney clamps were used to clamp the utero-ovarian ligament and fallopian tube.  This was cut and suture ligated.  Once the mass had been resected from the uterus, it was passed off.  Hemostasis was documented on the left adnexa.  The right ovary was then brought through the incision and inspected carefully because of our concern for bilaterality of dermoid cyst.  There was a small cyst on the right ovary and a scalpel was used to make an incision over the cyst. It was drained for clear fluid.  The cyst wall was resected and cauterized with the Bovie.  Another small incision was made over  a small cyst in the ovary however again serous fluid was drained.  The cyst wall was cauterized and resected.  No evidence of a dermoid was noted on the right ovary.  The uterus appeared normal.  The pelvis was then copiously irrigated with saline.  The peritoneum was closed with 0 Monocryl. Fascia was closed with a looped 0 PDS.  Skin was closed with staples. Sponge, lap, needle, and instrument counts were correct x2.  She was taken to the recovery room in stable condition.     Zelphia Cairo, MD     GA/MEDQ  D:  11/08/2011  T:  11/08/2011  Job:  [id number]

## 2011-11-08 NOTE — Anesthesia Procedure Notes (Signed)
Procedure Name: Intubation Date/Time: 11/08/2011 7:35 AM Performed by: Shanon Payor Pre-anesthesia Checklist: Suction available, Emergency Drugs available, Timeout performed, Patient being monitored and Patient identified Patient Re-evaluated:Patient Re-evaluated prior to inductionOxygen Delivery Method: Circle system utilized Preoxygenation: Pre-oxygenation with 100% oxygen Intubation Type: IV induction Ventilation: Mask ventilation without difficulty Laryngoscope Size: Mac and 3 Grade View: Grade II Tube type: Oral Tube size: 7.0 mm Number of attempts: 1 Airway Equipment and Method: Stylet Placement Confirmation: ETT inserted through vocal cords under direct vision,  positive ETCO2 and breath sounds checked- equal and bilateral Secured at: 22 cm Tube secured with: Tape Dental Injury: Teeth and Oropharynx as per pre-operative assessment

## 2011-11-08 NOTE — H&P (Signed)
31 yo w/ persistent right ovarian mass presents for surgical mngt.  US shows right ovarian cyst c/w possible endometrioma and another mass on the same ovary that resembles dermoid.  PMHx:  Neg PSHx:  c-section, T&A All:  Neg Meds:  PNV SHx:  Negative for tobacco  AF, VSS Gen - NAD CV - RRR Lungs - clear bilaterally Abd - soft, NT/ND PV - fullness in right adnexa w/ mild tenderness  [place] - dermoid & endometrioma right ovary.  Uterus and left adnexa normal  A/P:  Right adnexal mass Plan for laparoscopy w/ ovarian cystectomy and possible right oophorectomy.  Pt understands need for possible laparotomy.  R/b/a discussed and informed consents.  Questions answered

## 2011-11-08 NOTE — Anesthesia Postprocedure Evaluation (Signed)
  Anesthesia Post-op Note  Patient: [name]  Procedure(s) Performed: Procedure(s) (LRB): LAPAROSCOPY DIAGNOSTIC (N/A) LAPAROTOMY (N/A) OVARIAN CYSTECTOMY (Left)  Patient Location: PACU  Anesthesia Type: General  Level of Consciousness: awake, alert  and oriented  Airway and Oxygen Therapy: Patient Spontanous Breathing  Post-op Pain: mild  Post-op Assessment: Post-op Vital signs reviewed, Patient's Cardiovascular Status Stable, Respiratory Function Stable, Patent Airway, No signs of Nausea or vomiting and Pain level controlled  Post-op Vital Signs: Reviewed and stable  Complications: No apparent anesthesia complications

## 2011-11-08 NOTE — Progress Notes (Signed)
Day of Surgery Procedure(s) (LRB): LAPAROSCOPY DIAGNOSTIC (N/A) LAPAROTOMY (N/A) OVARIAN CYSTECTOMY (Left)  Subjective: Patient reports incisional pain and tolerating PO.    Objective: I have reviewed patient's vital signs, intake and output, medications and labs.  General: alert and cooperative GI: soft, non-tender; bowel sounds normal; no masses,  no organomegaly  Assessment: s/p Procedure(s) (LRB): LAPAROSCOPY DIAGNOSTIC (N/A) LAPAROTOMY (N/A) OVARIAN CYSTECTOMY (Left): progressing well  Plan: Advance diet Encourage ambulation Advance to PO medication  LOS: 0 days    Leelah Hanna 11/08/2011, 12:37 PM

## 2011-11-09 ENCOUNTER — Encounter (HOSPITAL_COMMUNITY): Payer: Self-pay | Admitting: Obstetrics and Gynecology

## 2011-11-09 LAB — CBC
Hemoglobin: 11.3 g/dL — ABNORMAL LOW (ref 12.0–15.0)
MCHC: 33.1 g/dL (ref 30.0–36.0)
Platelets: 209 10*3/uL (ref 150–400)

## 2011-11-09 MED ORDER — OXYCODONE-ACETAMINOPHEN 5-325 MG PO TABS: 1.0000 | ORAL_TABLET | ORAL | Status: AC | PRN

## 2011-11-09 MED ORDER — IBUPROFEN 200 MG PO TABS: 800.0000 mg | ORAL_TABLET | Freq: Three times a day (TID) | ORAL | Status: AC | PRN

## 2011-11-09 NOTE — Discharge Instructions (Signed)
Laparotomy Care After Please read the instructions outlined below and refer to this sheet in the next few weeks. These discharge instructions provide you with general information on caring for yourself after you leave the hospital. Your doctor may also give you specific instructions. While your treatment has been planned according to the most current medical practices available, unavoidable complications occasionally occur. If you have any problems or questions after discharge, please call your doctor. HOME CARE INSTRUCTIONS ACTIVITY  Rest as much as possible the first two weeks at home.   Avoid strenuous activity such as heavy lifting (more than 10 pounds), pushing or pulling. Limit stair climbing to once or twice a day for the first week, then slowly increase this activity.   Take frequent rest periods throughout the day.   Talk with your doctor about when you may resume your usual physical activity.   Patients need to be out of bed and walking as much as possible. This decreases the chance of:   Blood clots.   Pneumonia.  NUTRITION  You can resume your normal diet once you regain bowel function.   Drink plenty of fluids (6-8 glasses a day or as instructed).   Eat a well-balanced diet.   Daily portions of food from the meat (protein), milk, vegetable, and bread groups are necessary for your health.  ELIMINATION It is very important not to strain during bowel movements. If constipation should occur, you may:  Take a mild laxative (such as Milk of Magnesia)   Add fruit and bran to your diet   Drink more fluids  HYGIENE  You may wash your hair.   Take showers not baths until 4-6 weeks after surgery.   If your incision is closed, you may take a shower or tub bath.  FEVER If you feel feverish or have shaking chills, take your temperature. If it is 102 F (38.9 C), call your doctor. The fever may mean there is an infection. PAIN CONTROL  Mild discomfort may occur.    Only take over-the-counter or prescription medicines for pain, discomfort, or fever as directed by your caregiver.   If a prescription was given, please follow your doctor's directions.   If pain is not relieved or becomes more severe, notify your doctor.  INCISION CARE  Keep your incision site clean with soap and water.   Do not use a dressing unless your cut (incision) from surgery is draining or irritated.   If you have small adhesive strips in place and they do not fall off within 10 days, carefully peel them off.   Check your incision and surrounding area daily for any redness, swelling, discoloration, heavy drainage or separation of the skin. If any of these are present, notify your doctor.  SEXUAL INTERCOURSE Do not have sexual intercourse until your follow-up appointment, unless your doctor tells you otherwise. SEEK MEDICAL CARE IF:   You are unable to tolerate food/drinks.   You are unable to pass gas or have a bowel movement.  Document Released: 02/09/2004 Document Revised: 06/16/2011 Document Reviewed: 06/26/2007 Exit[employer] Patient Information 2012 [employer]. 

## 2011-11-09 NOTE — Progress Notes (Signed)
Pt discharged home with husband...Condition stable... No equipment... Ambulated to car with C. Riley, NT.  

## 2011-11-09 NOTE — Progress Notes (Signed)
1 Day Post-Op Procedure(s) (LRB): LAPAROSCOPY DIAGNOSTIC (N/A) LAPAROTOMY (N/A) OVARIAN CYSTECTOMY (Left)  Subjective: Patient reports tolerating PO and no problems voiding.    Objective: I have reviewed patient's vital signs, intake and output, medications and labs.  General: alert and cooperative GI: soft, non-tender; bowel sounds normal; no masses,  no organomegaly Extremities: extremities normal, atraumatic, no cyanosis or edema Vaginal Bleeding: none  Assessment: s/p Procedure(s) (LRB): LAPAROSCOPY DIAGNOSTIC (N/A) LAPAROTOMY (N/A) OVARIAN CYSTECTOMY (Left): stable, progressing well and tolerating diet  Plan: Advance diet Encourage ambulation Discharge home  LOS: 1 day    [name] 11/09/2011, 9:07 AM

## 2013-06-10 ENCOUNTER — Encounter (HOSPITAL_COMMUNITY): Payer: Self-pay

## 2013-06-10 ENCOUNTER — Inpatient Hospital Stay (HOSPITAL_COMMUNITY)
Admission: AD | Admit: 2013-06-10 | Discharge: 2013-06-10 | Disposition: A | Discharge status: Home / Self Care | Payer: 59 | Source: Ambulatory Visit | Attending: Obstetrics and Gynecology | Admitting: Obstetrics and Gynecology

## 2013-06-10 DIAGNOSIS — O00109 Unspecified tubal pregnancy without intrauterine pregnancy: Secondary | ICD-10-CM | POA: Insufficient documentation

## 2013-06-10 LAB — COMPREHENSIVE METABOLIC PANEL
ALT: 14 U/L (ref 0–35)
AST: 15 U/L (ref 0–37)
Albumin: 4.3 g/dL (ref 3.5–5.2)
Alkaline Phosphatase: 68 U/L (ref 39–117)
CO2: 25 mEq/L (ref 19–32)
Chloride: 102 mEq/L (ref 96–112)
Creatinine, Ser: 0.68 mg/dL (ref 0.50–1.10)
GFR calc non Af Amer: >90 mL/min (ref >90)
Potassium: 3.7 mEq/L (ref 3.5–5.1)
Sodium: 137 mEq/L (ref 135–145)
Total Bilirubin: 0.3 mg/dL (ref 0.3–1.2)

## 2013-06-10 LAB — CBC
MCV: 83 fL (ref 78.0–100.0)
Platelets: 233 10*3/uL (ref 150–400)
RBC: 4.36 MIL/uL (ref 3.87–5.11)
WBC: 7 10*3/uL (ref 4.0–10.5)

## 2013-06-10 MED ORDER — METHOTREXATE INJECTION FOR WOMEN'S HOSPITAL
50.0000 mg/m2 | Freq: Once | INTRAMUSCULAR | Status: AC
  Administered 2013-06-10: 100 mg via INTRAMUSCULAR
  Filled 2013-06-10: qty 2

## 2013-06-10 NOTE — MAU Provider Note (Signed)
Lab results reviewed with Dr Renaldo Fiddler  Results for orders placed during the hospital encounter of 06/10/13 (from the past 24 hour(s))  CBC     Status: None   Collection Time    06/10/13  4:54 PM      Result Value Range   WBC 7.0  4.0 - 10.5 K/uL   RBC 4.36  3.87 - 5.11 MIL/uL   Hemoglobin 12.5  12.0 - 15.0 g/dL   HCT 16.1  09.6 - 04.5 %   MCV 83.0  78.0 - 100.0 fL   MCH 28.7  26.0 - 34.0 pg   MCHC 34.5  30.0 - 36.0 g/dL   RDW 40.9  81.1 - 91.4 %   Platelets 233  150 - 400 K/uL  COMPREHENSIVE METABOLIC PANEL     Status: Abnormal   Collection Time    06/10/13  4:54 PM      Result Value Range   Sodium 137  135 - 145 mEq/L   Potassium 3.7  3.5 - 5.1 mEq/L   Chloride 102  96 - 112 mEq/L   CO2 25  19 - 32 mEq/L   Glucose, Bld 128 (*) 70 - 99 mg/dL   BUN 6  6 - 23 mg/dL   Creatinine, Ser 7.82  0.50 - 1.10 mg/dL   Calcium 9.0  8.4 - 95.6 mg/dL   Total Protein 7.6  6.0 - 8.3 g/dL   Albumin 4.3  3.5 - 5.2 g/dL   AST 15  0 - 37 U/L   ALT 14  0 - 35 U/L   Alkaline Phosphatase 68  39 - 117 U/L   Total Bilirubin 0.3  0.3 - 1.2 mg/dL   GFR calc non Af Amer >90  >90 mL/min   GFR calc Af Amer >90  >90 mL/min   Will proceed with Methotrexate as ordered  She will follow labs in office

## 2013-06-10 NOTE — Progress Notes (Signed)
Called for orders for MTX for pt. Dr. Renaldo Fiddler putting in orders now. RN will call back with results.

## 2013-06-10 NOTE — MAU Note (Signed)
Pt here for MTX injection. In past few weeks began bleeding with +UPT, BHCG x3, were going down, then today went back up. Denies pain or bleeding. Sent via Dr. Renaldo Fiddler for MTX.

## 2013-06-10 NOTE — MAU Note (Signed)
Pt given and discussed methotrexate handout

## 2013-06-15 ENCOUNTER — Encounter (HOSPITAL_COMMUNITY): Payer: Self-pay

## 2013-06-15 ENCOUNTER — Inpatient Hospital Stay (HOSPITAL_COMMUNITY): Payer: 59

## 2013-06-15 ENCOUNTER — Inpatient Hospital Stay (HOSPITAL_COMMUNITY)
Admission: AD | Admit: 2013-06-15 | Discharge: 2013-06-15 | Disposition: A | Discharge status: Home / Self Care | Payer: 59 | Source: Ambulatory Visit | Attending: Obstetrics and Gynecology | Admitting: Obstetrics and Gynecology

## 2013-06-15 DIAGNOSIS — R109 Unspecified abdominal pain: Secondary | ICD-10-CM | POA: Insufficient documentation

## 2013-06-15 DIAGNOSIS — O00109 Unspecified tubal pregnancy without intrauterine pregnancy: Secondary | ICD-10-CM | POA: Insufficient documentation

## 2013-06-15 DIAGNOSIS — O009 Unspecified ectopic pregnancy without intrauterine pregnancy: Secondary | ICD-10-CM

## 2013-06-15 LAB — CBC
MCH: 28.6 pg (ref 26.0–34.0)
MCHC: 34.6 g/dL (ref 30.0–36.0)
MCV: 82.6 fL (ref 78.0–100.0)
Platelets: 202 10*3/uL (ref 150–400)
RDW: 12.8 % (ref 11.5–15.5)

## 2013-06-15 LAB — HCG, QUANTITATIVE, PREGNANCY: hCG, Beta Chain, Quant, S: 436 m[IU]/mL — ABNORMAL HIGH (ref <5)

## 2013-06-15 MED ORDER — OXYCODONE-ACETAMINOPHEN 5-325 MG PO TABS: 2.0000 | ORAL_TABLET | ORAL | Status: DC | PRN

## 2013-06-15 MED ORDER — OXYCODONE-ACETAMINOPHEN 5-325 MG PO TABS
2.0000 | ORAL_TABLET | ORAL | Status: DC | PRN
Start: 2013-06-15 — End: 2014-07-19

## 2013-06-15 MED ORDER — BUTORPHANOL TARTRATE 1 MG/ML IJ SOLN
1.0000 mg | Freq: Once | INTRAMUSCULAR | Status: AC
  Administered 2013-06-15: 1 mg via INTRAMUSCULAR
  Filled 2013-06-15: qty 1

## 2013-06-15 NOTE — MAU Note (Signed)
Pain in right lower abd radiated to leg, was here for Methotrexate on Monday and had repeat bloodwork on Friday morning.  Reports bleeding like light period.

## 2013-06-15 NOTE — MAU Provider Note (Signed)
History     CSN: [id number]  Arrival date and time: 06/15/13 4098   First Provider Initiated Contact with Patient 06/15/13 [phone]      No chief complaint on file.  HPI  Ms [name] is a 32 y.o. female Unknown gestation [id number] who presents with increased abdominal pain. She has a known right ectopic pregnancy; received methotrexate 6 days ago and has been followed closely in the office. The pain started yesterday in the afternoon, she called the office and they told her to monitor the pain. The pain stopped yesterday, however this morning she woke up with increased pain. She currently rates her pain 7/10. Yesterday, per the patient her quant was 500.   OB History   Grav Para Term Preterm Abortions TAB SAB Ect Mult Living   2 1  1 1   1  1       Past Medical History  Diagnosis Date  . Acne     Past Surgical History  Procedure Laterality Date  . Cesarean section      05/2009  . Mandible surgery    . Tubes in ears      multiple as child  . Tonsillectomy and adenoidectomy    . Tonsillectomy    . Laparoscopy  11/08/2011    Procedure: LAPAROSCOPY DIAGNOSTIC;  Surgeon: Zelphia Cairo, MD;  Location: [facility];  Service: Gynecology;  Laterality: N/A;  . Laparotomy  11/08/2011    Procedure: LAPAROTOMY;  Surgeon: Zelphia Cairo, MD;  Location: [facility];  Service: Gynecology;  Laterality: N/A;  . Ovarian cyst removal  11/08/2011    Procedure: OVARIAN CYSTECTOMY;  Surgeon: Zelphia Cairo, MD;  Location: [facility];  Service: Gynecology;  Laterality: Left;    Family History  Problem Relation Age of Onset  . Hypertension Father     History  Substance Use Topics  . Smoking status: Never Smoker   . Smokeless tobacco: Never Used  . Alcohol Use: Yes     Comment: socially    Allergies: No Known Allergies  Prescriptions prior to admission  Medication Sig Dispense Refill  . clindamycin (CLEOCIN T) 1 % lotion Apply 1 application topically every morning.      Marland Kitchen doxycycline  (VIBRA-TABS) 100 MG tablet Take 100 mg by mouth daily. For acne tx.      . Multiple Vitamin (MULITIVITAMIN WITH MINERALS) TABS Take 1 tablet by mouth daily. Uses One-A-Day brand      . Norgestimate-Ethinyl Estradiol Triphasic (ORTHO TRI-CYCLEN LO) 0.18/0.215/0.25 MG-25 MCG tablet Take 1 tablet by mouth daily.      Marland Kitchen tretinoin (RETIN-A) 0.05 % cream Apply 1 application topically at bedtime.       Results for orders placed during the hospital encounter of 06/15/13 (from the past 24 hour(s))  CBC     Status: Abnormal   Collection Time    06/15/13  7:33 AM      Result Value Range   WBC 4.6  4.0 - 10.5 K/uL   RBC 4.13  3.87 - 5.11 MIL/uL   Hemoglobin 11.8 (*) 12.0 - 15.0 g/dL   HCT 47.8 (*) 29.5 - 62.1 %   MCV 82.6  78.0 - 100.0 fL   MCH 28.6  26.0 - 34.0 pg   MCHC 34.6  30.0 - 36.0 g/dL   RDW 30.8  65.7 - 84.6 %   Platelets 202  150 - 400 K/uL    Review of Systems  Constitutional: Negative for fever and chills.  Gastrointestinal: Positive for  abdominal pain. Negative for nausea and vomiting.       Lower, right sided abdominal pain   Genitourinary: Negative for dysuria, urgency, frequency and hematuria.       No vaginal discharge. + vaginal bleeding; lighter than a period  No dysuria.    Physical Exam   Blood pressure 144/87, pulse 86, temperature 97.9 F (36.6 C), temperature source Oral, resp. rate 18, height 5\' 7"  (1.702 m), weight 81.647 kg (180 lb), last menstrual period 04/26/2013, unknown if currently breastfeeding.  Physical Exam  Constitutional: She is oriented to person, place, and time. She appears well-developed and well-nourished. No distress.  HENT:  Head: Normocephalic.  Eyes: Pupils are equal, round, and reactive to light.  Neck: Neck supple.  Respiratory: Effort normal.  GI: Soft. She exhibits no distension. There is tenderness. There is guarding. There is no rebound.  +right lower quadrant tenderness with light palpation   Neurological: She is alert and  oriented to person, place, and time.  Skin: Skin is warm. She is not diaphoretic.  Psychiatric: Her behavior is normal.    MAU Course  Procedures None Results for orders placed during the hospital encounter of 06/15/13 (from the past 24 hour(s))  CBC     Status: Abnormal   Collection Time    06/15/13  7:33 AM      Result Value Range   WBC 4.6  4.0 - 10.5 K/uL   RBC 4.13  3.87 - 5.11 MIL/uL   Hemoglobin 11.8 (*) 12.0 - 15.0 g/dL   HCT 16.1 (*) 09.6 - 04.5 %   MCV 82.6  78.0 - 100.0 fL   MCH 28.6  26.0 - 34.0 pg   MCHC 34.6  30.0 - 36.0 g/dL   RDW 40.9  81.1 - 91.4 %   Platelets 202  150 - 400 K/uL  HCG, QUANTITATIVE, PREGNANCY     Status: Abnormal   Collection Time    06/15/13  7:33 AM      Result Value Range   hCG, Beta Chain, Quant, S 436 (*) <5 mIU/mL   MDM Consulted with Dr. Vincente Poli  CBC Beta Quant [place] 1 mg stadol IM  Dr. Vincente Poli here to see pt- will discharge home with pain med Ultrasound reviewed by Dr. Laurelyn Sickle Assessment and Plan  Report given to Pamelia Hoit NP  Ectopic pregnancy with falling QUANT and U/S reviewed by Dr. Renaldo Fiddler- Percocet #20 F/u in office on Monday to see Dr. [name], [name] 06/15/2013, 7:17 AM

## 2013-07-15 ENCOUNTER — Other Ambulatory Visit (HOSPITAL_COMMUNITY): Payer: Self-pay | Admitting: Obstetrics and Gynecology

## 2013-07-15 DIAGNOSIS — Z8742 Personal history of other diseases of the female genital tract: Secondary | ICD-10-CM

## 2013-07-24 ENCOUNTER — Ambulatory Visit (HOSPITAL_COMMUNITY)
Admission: RE | Admit: 2013-07-24 | Discharge: 2013-07-24 | Disposition: A | Discharge status: Home / Self Care | Payer: 59 | Source: Ambulatory Visit | Attending: Obstetrics and Gynecology | Admitting: Obstetrics and Gynecology

## 2013-07-24 ENCOUNTER — Encounter (INDEPENDENT_AMBULATORY_CARE_PROVIDER_SITE_OTHER): Payer: Self-pay

## 2013-07-24 DIAGNOSIS — Z8742 Personal history of other diseases of the female genital tract: Secondary | ICD-10-CM | POA: Insufficient documentation

## 2013-07-24 MED ORDER — IOHEXOL 300 MG/ML  SOLN
20.0000 mL | Freq: Once | INTRAMUSCULAR | Status: AC | PRN
  Administered 2013-07-24: 20 mL

## 2013-12-17 LAB — OB RESULTS CONSOLE RPR: RPR: NONREACTIVE

## 2013-12-17 LAB — OB RESULTS CONSOLE HIV ANTIBODY (ROUTINE TESTING): HIV: NONREACTIVE

## 2013-12-17 LAB — OB RESULTS CONSOLE GC/CHLAMYDIA
CHLAMYDIA, DNA PROBE: NEGATIVE
GC PROBE AMP, GENITAL: NEGATIVE

## 2013-12-17 LAB — OB RESULTS CONSOLE ABO/RH: RH TYPE: POSITIVE

## 2013-12-17 LAB — OB RESULTS CONSOLE RUBELLA ANTIBODY, IGM: Rubella: IMMUNE

## 2013-12-17 LAB — OB RESULTS CONSOLE ANTIBODY SCREEN: Antibody Screen: NEGATIVE

## 2013-12-17 LAB — OB RESULTS CONSOLE HEPATITIS B SURFACE ANTIGEN: Hepatitis B Surface Ag: NEGATIVE

## 2014-05-11 ENCOUNTER — Inpatient Hospital Stay (HOSPITAL_COMMUNITY): Admission: AD | Admit: 2014-05-11 | Payer: 59 | Source: Ambulatory Visit | Admitting: Obstetrics and Gynecology

## 2014-05-12 ENCOUNTER — Encounter (HOSPITAL_COMMUNITY): Payer: Self-pay

## 2014-06-12 LAB — OB RESULTS CONSOLE GBS: GBS: NEGATIVE

## 2014-07-11 NOTE — L&D Delivery Note (Signed)
Delivery Note/ VBAC  SVD viable female Apgars 78m9 over 1st degree lac.  Placenta delivered spontaneously intact with 3VC. Repair with 2-0 Chromic with good support and hemostasis also repaired bil periurethral lacs .  PH art was sent.  [place] cord blood was not done.  Mother and baby were doing well.  EBL 300cc  Louretta Shorten, MD

## 2014-07-16 ENCOUNTER — Encounter (HOSPITAL_COMMUNITY): Payer: Self-pay | Admitting: *Deleted

## 2014-07-16 ENCOUNTER — Inpatient Hospital Stay (HOSPITAL_COMMUNITY)
Admission: AD | Admit: 2014-07-16 | Discharge: 2014-07-16 | Disposition: A | Discharge status: Home / Self Care | Payer: 59 | Source: Ambulatory Visit | Attending: Obstetrics and Gynecology | Admitting: Obstetrics and Gynecology

## 2014-07-16 DIAGNOSIS — O471 False labor at or after 37 completed weeks of gestation: Secondary | ICD-10-CM | POA: Insufficient documentation

## 2014-07-16 DIAGNOSIS — Z3A40 40 weeks gestation of pregnancy: Secondary | ICD-10-CM | POA: Diagnosis not present

## 2014-07-16 LAB — URINALYSIS, ROUTINE W REFLEX MICROSCOPIC
BILIRUBIN URINE: NEGATIVE
Glucose, UA: NEGATIVE mg/dL
Hgb urine dipstick: NEGATIVE
Ketones, ur: NEGATIVE mg/dL
Leukocytes, UA: NEGATIVE
Nitrite: NEGATIVE
PROTEIN: NEGATIVE mg/dL
SPECIFIC GRAVITY, URINE: 1.015 (ref 1.005–1.030)
Urobilinogen, UA: 0.2 mg/dL (ref 0.0–1.0)
pH: 6 (ref 5.0–8.0)

## 2014-07-16 NOTE — MAU Note (Signed)
Pt reports contractions all day, some blood tinged discharge. Was 2 cm in office yesterday.

## 2014-07-16 NOTE — Discharge Instructions (Signed)
Third Trimester of Pregnancy The third trimester is from week 29 through week 42, months 7 through 9. The third trimester is a time when the fetus is growing rapidly. At the end of the ninth month, the fetus is about 20 inches in length and weighs 6-10 pounds.  BODY CHANGES Your body goes through many changes during pregnancy. The changes vary from woman to woman.   Your weight will continue to increase. You can expect to gain 25-35 pounds (11-16 kg) by the end of the pregnancy.  You may begin to get stretch marks on your hips, abdomen, and breasts.  You may urinate more often because the fetus is moving lower into your pelvis and pressing on your bladder.  You may develop or continue to have heartburn as a result of your pregnancy.  You may develop constipation because certain hormones are causing the muscles that push waste through your intestines to slow down.  You may develop hemorrhoids or swollen, bulging veins (varicose veins).  You may have pelvic pain because of the weight gain and pregnancy hormones relaxing your joints between the bones in your pelvis. Backaches may result from overexertion of the muscles supporting your posture.  You may have changes in your hair. These can include thickening of your hair, rapid growth, and changes in texture. Some women also have hair loss during or after pregnancy, or hair that feels dry or thin. Your hair will most likely return to normal after your baby is born.  Your breasts will continue to grow and be tender. A yellow discharge may leak from your breasts called colostrum.  Your belly button may stick out.  You may feel short of breath because of your expanding uterus.  You may notice the fetus "dropping," or moving lower in your abdomen.  You may have a bloody mucus discharge. This usually occurs a few days to a week before labor begins.  Your cervix becomes thin and soft (effaced) near your due date. WHAT TO EXPECT AT YOUR PRENATAL  EXAMS  You will have prenatal exams every 2 weeks until week 36. Then, you will have weekly prenatal exams. During a routine prenatal visit:  You will be weighed to make sure you and the fetus are growing normally.  Your blood pressure is taken.  Your abdomen will be measured to track your baby's growth.  The fetal heartbeat will be listened to.  Any test results from the previous visit will be discussed.  You may have a cervical check near your due date to see if you have effaced. At around 36 weeks, your caregiver will check your cervix. At the same time, your caregiver will also perform a test on the secretions of the vaginal tissue. This test is to determine if a type of bacteria, Group B streptococcus, is present. Your caregiver will explain this further. Your caregiver may ask you:  What your birth plan is.  How you are feeling.  If you are feeling the baby move.  If you have had any abnormal symptoms, such as leaking fluid, bleeding, severe headaches, or abdominal cramping.  If you have any questions. Other tests or screenings that may be performed during your third trimester include:  Blood tests that check for low iron levels (anemia).  Fetal testing to check the health, activity level, and growth of the fetus. Testing is done if you have certain medical conditions or if there are problems during the pregnancy. FALSE LABOR You may feel small, irregular contractions that  eventually go away. These are called Braxton Hicks contractions, or false labor. Contractions may last for hours, days, or even weeks before true labor sets in. If contractions come at regular intervals, intensify, or become painful, it is best to be seen by your caregiver.  SIGNS OF LABOR   Menstrual-like cramps.  Contractions that are 5 minutes apart or less.  Contractions that start on the top of the uterus and spread down to the lower abdomen and back.  A sense of increased pelvic pressure or back  pain.  A watery or bloody mucus discharge that comes from the vagina. If you have any of these signs before the 37th week of pregnancy, call your caregiver right away. You need to go to the hospital to get checked immediately. HOME CARE INSTRUCTIONS   Avoid all smoking, herbs, alcohol, and unprescribed drugs. These chemicals affect the formation and growth of the baby.  Follow your caregiver's instructions regarding medicine use. There are medicines that are either safe or unsafe to take during pregnancy.  Exercise only as directed by your caregiver. Experiencing uterine cramps is a good sign to stop exercising.  Continue to eat regular, healthy meals.  Wear a good support bra for breast tenderness.  Do not use hot tubs, steam rooms, or saunas.  Wear your seat belt at all times when driving.  Avoid raw meat, uncooked cheese, cat litter boxes, and soil used by cats. These carry germs that can cause birth defects in the baby.  Take your prenatal vitamins.  Try taking a stool softener (if your caregiver approves) if you develop constipation. Eat more high-fiber foods, such as fresh vegetables or fruit and whole grains. Drink plenty of fluids to keep your urine clear or pale yellow.  Take warm sitz baths to soothe any pain or discomfort caused by hemorrhoids. Use hemorrhoid cream if your caregiver approves.  If you develop varicose veins, wear support hose. Elevate your feet for 15 minutes, 3-4 times a day. Limit salt in your diet.  Avoid heavy lifting, wear low heal shoes, and practice good posture.  Rest a lot with your legs elevated if you have leg cramps or low back pain.  Visit your dentist if you have not gone during your pregnancy. Use a soft toothbrush to brush your teeth and be gentle when you floss.  A sexual relationship may be continued unless your caregiver directs you otherwise.  Do not travel far distances unless it is absolutely necessary and only with the approval  of your caregiver.  Take prenatal classes to understand, practice, and ask questions about the labor and delivery.  Make a trial run to the hospital.  Pack your hospital bag.  Prepare the baby's nursery.  Continue to go to all your prenatal visits as directed by your caregiver. SEEK MEDICAL CARE IF:  You are unsure if you are in labor or if your water has broken.  You have dizziness.  You have mild pelvic cramps, pelvic pressure, or nagging pain in your abdominal area.  You have persistent nausea, vomiting, or diarrhea.  You have a bad smelling vaginal discharge.  You have pain with urination. SEEK IMMEDIATE MEDICAL CARE IF:   You have a fever.  You are leaking fluid from your vagina.  You have spotting or bleeding from your vagina.  You have severe abdominal cramping or pain.  You have rapid weight loss or gain.  You have shortness of breath with chest pain.  You notice sudden or extreme swelling  of your face, hands, ankles, feet, or legs.  You have not felt your baby move in over an hour.  You have severe headaches that do not go away with medicine.  You have vision changes. Document Released: 06/21/2001 Document Revised: 07/02/2013 Document Reviewed: 08/28/2012 [facility] Patient Information 2015 [place], [place]. This information is not intended to replace advice given to you by your health care provider. Make sure you discuss any questions you have with your health care provider. Fetal Movement Counts Patient Name: __________________________________________________ Patient Due Date: ____________________ Performing a fetal movement count is highly recommended in high-risk pregnancies, but it is good for every pregnant woman to do. Your health care provider may ask you to start counting fetal movements at 28 weeks of the pregnancy. Fetal movements often increase:  After eating a full meal.  After physical activity.  After eating or drinking something sweet or  cold.  At rest. Pay attention to when you feel the baby is most active. This will help you notice a pattern of your baby's sleep and wake cycles and what factors contribute to an increase in fetal movement. It is important to perform a fetal movement count at the same time each day when your baby is normally most active.  HOW TO COUNT FETAL MOVEMENTS  Find a quiet and comfortable area to sit or lie down on your left side. Lying on your left side provides the best blood and oxygen circulation to your baby.  Write down the day and time on a sheet of paper or in a journal.  Start counting kicks, flutters, swishes, rolls, or jabs in a 2-hour period. You should feel at least 10 movements within 2 hours.  If you do not feel 10 movements in 2 hours, wait 2-3 hours and count again. Look for a change in the pattern or not enough counts in 2 hours. SEEK MEDICAL CARE IF:  You feel less than 10 counts in 2 hours, tried twice.  There is no movement in over an hour.  The pattern is changing or taking longer each day to reach 10 counts in 2 hours.  You feel the baby is not moving as he or she usually does. Date: ____________ Movements: ____________ Start time: ____________ Elizebeth Koller time: ____________  Date: ____________ Movements: ____________ Start time: ____________ Elizebeth Koller time: ____________ Date: ____________ Movements: ____________ Start time: ____________ Elizebeth Koller time: ____________ Date: ____________ Movements: ____________ Start time: ____________ Elizebeth Koller time: ____________ Date: ____________ Movements: ____________ Start time: ____________ Elizebeth Koller time: ____________ Date: ____________ Movements: ____________ Start time: ____________ Elizebeth Koller time: ____________ Date: ____________ Movements: ____________ Start time: ____________ Elizebeth Koller time: ____________ Date: ____________ Movements: ____________ Start time: ____________ Elizebeth Koller time: ____________  Date: ____________ Movements: ____________ Start time:  ____________ Elizebeth Koller time: ____________ Date: ____________ Movements: ____________ Start time: ____________ Elizebeth Koller time: ____________ Date: ____________ Movements: ____________ Start time: ____________ Elizebeth Koller time: ____________ Date: ____________ Movements: ____________ Start time: ____________ Elizebeth Koller time: ____________ Date: ____________ Movements: ____________ Start time: ____________ Elizebeth Koller time: ____________ Date: ____________ Movements: ____________ Start time: ____________ Elizebeth Koller time: ____________ Date: ____________ Movements: ____________ Start time: ____________ Elizebeth Koller time: ____________  Date: ____________ Movements: ____________ Start time: ____________ Elizebeth Koller time: ____________ Date: ____________ Movements: ____________ Start time: ____________ Elizebeth Koller time: ____________ Date: ____________ Movements: ____________ Start time: ____________ Elizebeth Koller time: ____________ Date: ____________ Movements: ____________ Start time: ____________ Elizebeth Koller time: ____________ Date: ____________ Movements: ____________ Start time: ____________ Elizebeth Koller time: ____________ Date: ____________ Movements: ____________ Start time: ____________ Elizebeth Koller time: ____________ Date: ____________ Movements: ____________ Start time: ____________ Elizebeth Koller time:  ____________  Date: ____________ Movements: ____________ Start time: ____________ Elizebeth Koller time: ____________ Date: ____________ Movements: ____________ Start time: ____________ Elizebeth Koller time: ____________ Date: ____________ Movements: ____________ Start time: ____________ Elizebeth Koller time: ____________ Date: ____________ Movements: ____________ Start time: ____________ Elizebeth Koller time: ____________ Date: ____________ Movements: ____________ Start time: ____________ Elizebeth Koller time: ____________ Date: ____________ Movements: ____________ Start time: ____________ Elizebeth Koller time: ____________ Date: ____________ Movements: ____________ Start time: ____________ Elizebeth Koller time: ____________  Date:  ____________ Movements: ____________ Start time: ____________ Elizebeth Koller time: ____________ Date: ____________ Movements: ____________ Start time: ____________ Elizebeth Koller time: ____________ Date: ____________ Movements: ____________ Start time: ____________ Elizebeth Koller time: ____________ Date: ____________ Movements: ____________ Start time: ____________ Elizebeth Koller time: ____________ Date: ____________ Movements: ____________ Start time: ____________ Elizebeth Koller time: ____________ Date: ____________ Movements: ____________ Start time: ____________ Elizebeth Koller time: ____________ Date: ____________ Movements: ____________ Start time: ____________ Elizebeth Koller time: ____________  Date: ____________ Movements: ____________ Start time: ____________ Elizebeth Koller time: ____________ Date: ____________ Movements: ____________ Start time: ____________ Elizebeth Koller time: ____________ Date: ____________ Movements: ____________ Start time: ____________ Elizebeth Koller time: ____________ Date: ____________ Movements: ____________ Start time: ____________ Elizebeth Koller time: ____________ Date: ____________ Movements: ____________ Start time: ____________ Elizebeth Koller time: ____________ Date: ____________ Movements: ____________ Start time: ____________ Elizebeth Koller time: ____________ Date: ____________ Movements: ____________ Start time: ____________ Elizebeth Koller time: ____________  Date: ____________ Movements: ____________ Start time: ____________ Elizebeth Koller time: ____________ Date: ____________ Movements: ____________ Start time: ____________ Elizebeth Koller time: ____________ Date: ____________ Movements: ____________ Start time: ____________ Elizebeth Koller time: ____________ Date: ____________ Movements: ____________ Start time: ____________ Elizebeth Koller time: ____________ Date: ____________ Movements: ____________ Start time: ____________ Elizebeth Koller time: ____________ Date: ____________ Movements: ____________ Start time: ____________ Elizebeth Koller time: ____________ Date: ____________ Movements: ____________ Start  time: ____________ Elizebeth Koller time: ____________  Date: ____________ Movements: ____________ Start time: ____________ Elizebeth Koller time: ____________ Date: ____________ Movements: ____________ Start time: ____________ Elizebeth Koller time: ____________ Date: ____________ Movements: ____________ Start time: ____________ Elizebeth Koller time: ____________ Date: ____________ Movements: ____________ Start time: ____________ Elizebeth Koller time: ____________ Date: ____________ Movements: ____________ Start time: ____________ Elizebeth Koller time: ____________ Date: ____________ Movements: ____________ Start time: ____________ Elizebeth Koller time: ____________ Document Released: 07/27/2006 Document Revised: 11/11/2013 Document Reviewed: 04/23/2012 [employer] Patient Information 2015 [employer]. This information is not intended to replace advice given to you by your health care provider. Make sure you discuss any questions you have with your health care provider. Braxton Hicks Contractions Contractions of the uterus can occur throughout pregnancy. Contractions are not always a sign that you are in labor.  WHAT ARE BRAXTON HICKS CONTRACTIONS?  Contractions that occur before labor are called Braxton Hicks contractions, or false labor. Toward the end of pregnancy (32-34 weeks), these contractions can develop more often and may become more forceful. This is not true labor because these contractions do not result in opening (dilatation) and thinning of the cervix. They are sometimes difficult to tell apart from true labor because these contractions can be forceful and people have different pain tolerances. You should not feel embarrassed if you go to the hospital with false labor. Sometimes, the only way to tell if you are in true labor is for your health care provider to look for changes in the cervix. If there are no prenatal problems or other health problems associated with the pregnancy, it is completely safe to be sent home with false labor and await the  onset of true labor. HOW CAN YOU TELL THE DIFFERENCE BETWEEN TRUE AND FALSE LABOR? False Labor  The contractions of false labor are usually shorter and not as hard as those of true labor.   The contractions  are usually irregular.   The contractions are often felt in the front of the lower abdomen and in the groin.   The contractions may go away when you walk around or change positions while lying down.   The contractions get weaker and are shorter lasting as time goes on.   The contractions do not usually become progressively stronger, regular, and closer together as with true labor.  True Labor  Contractions in true labor last 30-70 seconds, become very regular, usually become more intense, and increase in frequency.   The contractions do not go away with walking.   The discomfort is usually felt in the top of the uterus and spreads to the lower abdomen and low back.   True labor can be determined by your health care provider with an exam. This will show that the cervix is dilating and getting thinner.  WHAT TO REMEMBER  Keep up with your usual exercises and follow other instructions given by your health care provider.   Take medicines as directed by your health care provider.   Keep your regular prenatal appointments.   Eat and drink lightly if you think you are going into labor.   If Braxton Hicks contractions are making you uncomfortable:   Change your position from lying down or resting to walking, or from walking to resting.   Sit and rest in a tub of warm water.   Drink 2-3 glasses of water. Dehydration may cause these contractions.   Do slow and deep breathing several times an hour.  WHEN SHOULD I SEEK IMMEDIATE MEDICAL CARE? Seek immediate medical care if:  Your contractions become stronger, more regular, and closer together.   You have fluid leaking or gushing from your vagina.   You have a fever.   You pass blood-tinged mucus.    You have vaginal bleeding.   You have continuous abdominal pain.   You have low back pain that you never had before.   You feel your baby's head pushing down and causing pelvic pressure.   Your baby is not moving as much as it used to.  Document Released: 06/27/2005 Document Revised: 07/02/2013 Document Reviewed: 04/08/2013 [facility] Patient Information 2015 [place], [place]. This information is not intended to replace advice given to you by your health care provider. Make sure you discuss any questions you have with your health care provider.

## 2014-07-16 NOTE — Progress Notes (Signed)
Message left requesting call back for report

## 2014-07-16 NOTE — Progress Notes (Signed)
Called back and report given. Will discharge home with labor precations

## 2014-07-17 ENCOUNTER — Encounter (HOSPITAL_COMMUNITY): Payer: Self-pay | Admitting: *Deleted

## 2014-07-17 ENCOUNTER — Inpatient Hospital Stay (HOSPITAL_COMMUNITY)
Admission: AD | Admit: 2014-07-17 | Discharge: 2014-07-19 | DRG: 775 | Disposition: A | Discharge status: Home / Self Care | Payer: 59 | Source: Ambulatory Visit | Attending: Obstetrics and Gynecology | Admitting: Obstetrics and Gynecology

## 2014-07-17 ENCOUNTER — Inpatient Hospital Stay (HOSPITAL_COMMUNITY): Payer: 59 | Admitting: Anesthesiology

## 2014-07-17 DIAGNOSIS — O3421 Maternal care for scar from previous cesarean delivery: Secondary | ICD-10-CM | POA: Diagnosis present

## 2014-07-17 DIAGNOSIS — Z3A40 40 weeks gestation of pregnancy: Secondary | ICD-10-CM | POA: Diagnosis present

## 2014-07-17 DIAGNOSIS — Z8249 Family history of ischemic heart disease and other diseases of the circulatory system: Secondary | ICD-10-CM | POA: Diagnosis not present

## 2014-07-17 DIAGNOSIS — IMO0001 Reserved for inherently not codable concepts without codable children: Secondary | ICD-10-CM

## 2014-07-17 HISTORY — DX: Scoliosis, unspecified: M41.9

## 2014-07-17 LAB — CBC
HCT: 35.6 % — ABNORMAL LOW (ref 36.0–46.0)
HEMOGLOBIN: 12 g/dL (ref 12.0–15.0)
MCH: 29.1 pg (ref 26.0–34.0)
MCHC: 33.7 g/dL (ref 30.0–36.0)
MCV: 86.2 fL (ref 78.0–100.0)
Platelets: 183 10*3/uL (ref 150–400)
RBC: 4.13 MIL/uL (ref 3.87–5.11)
RDW: 13.7 % (ref 11.5–15.5)
WBC: 9.4 10*3/uL (ref 4.0–10.5)

## 2014-07-17 LAB — TYPE AND SCREEN
ABO/RH(D): A POS
Antibody Screen: NEGATIVE

## 2014-07-17 LAB — RPR

## 2014-07-17 MED ORDER — LACTATED RINGERS IV SOLN
INTRAVENOUS | Status: DC
  Administered 2014-07-17 (×2): via INTRAVENOUS

## 2014-07-17 MED ORDER — OXYCODONE-ACETAMINOPHEN 5-325 MG PO TABS: 2.0000 | ORAL_TABLET | ORAL | Status: DC | PRN

## 2014-07-17 MED ORDER — LACTATED RINGERS IV SOLN: 500.0000 mL | INTRAVENOUS | Status: DC | PRN

## 2014-07-17 MED ORDER — LIDOCAINE HCL (PF) 1 % IJ SOLN
30.0000 mL | INTRAMUSCULAR | Status: DC | PRN
  Filled 2014-07-17: qty 30

## 2014-07-17 MED ORDER — FENTANYL 2.5 MCG/ML BUPIVACAINE 1/10 % EPIDURAL INFUSION (WH - ANES)
14.0000 mL/h | INTRAMUSCULAR | Status: DC | PRN
  Administered 2014-07-17: 14 mL/h via EPIDURAL

## 2014-07-17 MED ORDER — ACETAMINOPHEN 325 MG PO TABS: 650.0000 mg | ORAL_TABLET | ORAL | Status: DC | PRN

## 2014-07-17 MED ORDER — EPHEDRINE 5 MG/ML INJ
10.0000 mg | INTRAVENOUS | Status: DC | PRN
  Filled 2014-07-17: qty 2

## 2014-07-17 MED ORDER — ONDANSETRON HCL 4 MG/2ML IJ SOLN: 4.0000 mg | Freq: Four times a day (QID) | INTRAMUSCULAR | Status: DC | PRN

## 2014-07-17 MED ORDER — LIDOCAINE HCL (PF) 1 % IJ SOLN
INTRAMUSCULAR | Status: DC | PRN
  Administered 2014-07-17: 4 mL
  Administered 2014-07-17: 6 mL

## 2014-07-17 MED ORDER — PHENYLEPHRINE 40 MCG/ML (10ML) SYRINGE FOR IV PUSH (FOR BLOOD PRESSURE SUPPORT)
80.0000 ug | PREFILLED_SYRINGE | INTRAVENOUS | Status: DC | PRN
  Filled 2014-07-17: qty 2

## 2014-07-17 MED ORDER — PHENYLEPHRINE 40 MCG/ML (10ML) SYRINGE FOR IV PUSH (FOR BLOOD PRESSURE SUPPORT)
80.0000 ug | PREFILLED_SYRINGE | INTRAVENOUS | Status: DC | PRN
  Filled 2014-07-17: qty 20
  Filled 2014-07-17: qty 2

## 2014-07-17 MED ORDER — OXYTOCIN 40 UNITS IN LACTATED RINGERS INFUSION - SIMPLE MED
1.0000 m[IU]/min | INTRAVENOUS | Status: DC
  Administered 2014-07-17: 2 m[IU]/min via INTRAVENOUS
  Filled 2014-07-17: qty 1000

## 2014-07-17 MED ORDER — TERBUTALINE SULFATE 1 MG/ML IJ SOLN: 0.2500 mg | Freq: Once | INTRAMUSCULAR | Status: AC | PRN

## 2014-07-17 MED ORDER — CITRIC ACID-SODIUM CITRATE 334-500 MG/5ML PO SOLN: 30.0000 mL | ORAL | Status: DC | PRN

## 2014-07-17 MED ORDER — OXYCODONE-ACETAMINOPHEN 5-325 MG PO TABS: 1.0000 | ORAL_TABLET | ORAL | Status: DC | PRN

## 2014-07-17 MED ORDER — LACTATED RINGERS IV SOLN
500.0000 mL | Freq: Once | INTRAVENOUS | Status: AC
  Administered 2014-07-17: 500 mL via INTRAVENOUS

## 2014-07-17 MED ORDER — OXYTOCIN BOLUS FROM INFUSION: 500.0000 mL | INTRAVENOUS | Status: DC

## 2014-07-17 MED ORDER — FENTANYL 2.5 MCG/ML BUPIVACAINE 1/10 % EPIDURAL INFUSION (WH - ANES)
14.0000 mL/h | INTRAMUSCULAR | Status: DC | PRN
  Filled 2014-07-17: qty 125

## 2014-07-17 MED ORDER — OXYTOCIN 40 UNITS IN LACTATED RINGERS INFUSION - SIMPLE MED
62.5000 mL/h | INTRAVENOUS | Status: DC
  Administered 2014-07-17: 62.5 mL/h via INTRAVENOUS

## 2014-07-17 MED ORDER — DIPHENHYDRAMINE HCL 50 MG/ML IJ SOLN: 12.5000 mg | INTRAMUSCULAR | Status: DC | PRN

## 2014-07-17 NOTE — Anesthesia Procedure Notes (Signed)
Epidural Patient location during procedure: OB  Preanesthetic Checklist Completed: patient identified, site marked, surgical consent, pre-op evaluation, timeout performed, IV checked, risks and benefits discussed and monitors and equipment checked  Epidural Patient position: sitting Prep: site prepped and draped and DuraPrep Patient monitoring: continuous pulse ox and blood pressure Approach: midline Location: L3-L4 Injection technique: LOR air  Needle:  Needle type: Tuohy  Needle gauge: 17 G Needle length: 9 cm and 9 Needle insertion depth: 5 cm cm Catheter type: closed end flexible Catheter size: 19 Gauge Catheter at skin depth: 10 cm Test dose: negative  Assessment Events: blood not aspirated, injection not painful, no injection resistance, negative IV test and no paresthesia  Additional Notes Dosing of Epidural:  1st dose, through catheter ............................................Marland Kitchen  Xylocaine 40 mg  2nd dose, through catheter, after waiting 3 minutes........Marland KitchenXylocaine 60 mg    ( 1% Xylo charted as a single dose in Epic Meds for ease of charting; actual dosing was fractionated as above, for saftey's sake)  As each dose occurred, patient was free of IV sx; and patient exhibited no evidence of SA injection.  Patient is more comfortable after epidural dosed. Please see RN's note for documentation of vital signs,and FHR which are stable.  Patient reminded not to try to ambulate with numb legs, and that an RN must be present when she attempts to get up.

## 2014-07-17 NOTE — MAU Note (Signed)
Pt C/O uc's during the night, have become 5 minutes apart for the last 2 hours.  Denies bleeding or LOF.  For TOLAC.

## 2014-07-17 NOTE — H&P (Signed)
[name] is a 35 y.o. female presenting for labor sxs.  Pregnancy uncomplicated.  Previous c/s for previa and desires TOL.  GBS -. History OB History    Gravida Para Term Preterm AB TAB SAB Ectopic Multiple Living   3 1  1 1   1  1      Past Medical History  Diagnosis Date  . Acne   . Scoliosis    Past Surgical History  Procedure Laterality Date  . Cesarean section      05/2009  . Mandible surgery    . Tubes in ears      multiple as child  . Tonsillectomy and adenoidectomy    . Tonsillectomy    . Laparoscopy  11/08/2011    Procedure: LAPAROSCOPY DIAGNOSTIC;  Surgeon: Marylynn Pearson, MD;  Location: [facility];  Service: Gynecology;  Laterality: N/A;  . Laparotomy  11/08/2011    Procedure: LAPAROTOMY;  Surgeon: Marylynn Pearson, MD;  Location: [facility];  Service: Gynecology;  Laterality: N/A;  . Ovarian cyst removal  11/08/2011    Procedure: OVARIAN CYSTECTOMY;  Surgeon: Marylynn Pearson, MD;  Location: [facility];  Service: Gynecology;  Laterality: Left;   Family History: family history includes Hypertension in her father. Social History:  reports that she has never smoked. She has never used smokeless tobacco. She reports that she drinks alcohol. She reports that she does not use illicit drugs.   Prenatal Transfer Tool  Maternal Diabetes: No Genetic Screening: Normal Maternal Ultrasounds/Referrals: Normal Fetal Ultrasounds or other Referrals:  None Maternal Substance Abuse:  No Significant Maternal Medications:  None Significant Maternal Lab Results:  None Other Comments:  None  ROS  Dilation: 4 Effacement (%): 90 Station: -2 Exam by:: S. Carrera, RNC Blood pressure 128/82, pulse 78, temperature 98.5 F (36.9 C), temperature source Oral, resp. rate 18, last menstrual period 07/15/2013. Exam Physical Exam  Prenatal labs: ABO, Rh: A/Positive/-- (06/09 0000) Antibody: Negative (06/09 0000) Rubella: Immune (06/09 0000) RPR: Nonreactive (06/09 0000)  HBsAg: Negative (06/09  0000)  HIV: Non-reactive (06/09 0000)  GBS: Negative (12/03 0000)   Assessment/Plan: IUP at term in active labor Prev C/S desires TOL for VBAC  R&B reviewed and informed consent obtained  Bevin Das C 07/17/2014, 2:22 PM

## 2014-07-17 NOTE — Anesthesia Preprocedure Evaluation (Signed)

## 2014-07-18 ENCOUNTER — Encounter (HOSPITAL_COMMUNITY): Payer: Self-pay | Admitting: *Deleted

## 2014-07-18 LAB — HIV ANTIBODY (ROUTINE TESTING W REFLEX): HIV-1/HIV-2 Ab: NONREACTIVE

## 2014-07-18 LAB — CBC
HEMATOCRIT: 32.9 % — AB (ref 36.0–46.0)
HEMOGLOBIN: 11.2 g/dL — AB (ref 12.0–15.0)
MCH: 29.6 pg (ref 26.0–34.0)
MCHC: 34 g/dL (ref 30.0–36.0)
MCV: 86.8 fL (ref 78.0–100.0)
Platelets: 166 10*3/uL (ref 150–400)
RBC: 3.79 MIL/uL — ABNORMAL LOW (ref 3.87–5.11)
RDW: 13.8 % (ref 11.5–15.5)
WBC: 9.4 10*3/uL (ref 4.0–10.5)

## 2014-07-18 MED ORDER — IBUPROFEN 600 MG PO TABS
600.0000 mg | ORAL_TABLET | Freq: Four times a day (QID) | ORAL | Status: DC
  Administered 2014-07-18 – 2014-07-19 (×7): 600 mg via ORAL
  Filled 2014-07-18 (×7): qty 1

## 2014-07-18 MED ORDER — ZOLPIDEM TARTRATE 5 MG PO TABS: 5.0000 mg | ORAL_TABLET | Freq: Every evening | ORAL | Status: DC | PRN

## 2014-07-18 MED ORDER — ONDANSETRON HCL 4 MG PO TABS: 4.0000 mg | ORAL_TABLET | ORAL | Status: DC | PRN

## 2014-07-18 MED ORDER — LANOLIN HYDROUS EX OINT
TOPICAL_OINTMENT | CUTANEOUS | Status: DC | PRN
Start: 2014-07-18 — End: 2014-07-19

## 2014-07-18 MED ORDER — OXYCODONE-ACETAMINOPHEN 5-325 MG PO TABS
2.0000 | ORAL_TABLET | ORAL | Status: DC | PRN
Start: 2014-07-18 — End: 2014-07-19

## 2014-07-18 MED ORDER — MEDROXYPROGESTERONE ACETATE 150 MG/ML IM SUSP: 150.0000 mg | INTRAMUSCULAR | Status: DC | PRN

## 2014-07-18 MED ORDER — TETANUS-DIPHTH-ACELL PERTUSSIS 5-2.5-18.5 LF-MCG/0.5 IM SUSP: 0.5000 mL | Freq: Once | INTRAMUSCULAR | Status: DC

## 2014-07-18 MED ORDER — DIBUCAINE 1 % RE OINT: 1.0000 "application " | TOPICAL_OINTMENT | RECTAL | Status: DC | PRN

## 2014-07-18 MED ORDER — AMOXICILLIN-POT CLAVULANATE 875-125 MG PO TABS
1.0000 | ORAL_TABLET | Freq: Two times a day (BID) | ORAL | Status: DC
  Administered 2014-07-18 – 2014-07-19 (×4): 1 via ORAL
  Filled 2014-07-18 (×4): qty 1

## 2014-07-18 MED ORDER — PRENATAL MULTIVITAMIN CH
1.0000 | ORAL_TABLET | Freq: Every day | ORAL | Status: DC
  Administered 2014-07-18 – 2014-07-19 (×2): 1 via ORAL
  Filled 2014-07-18 (×2): qty 1

## 2014-07-18 MED ORDER — SENNOSIDES-DOCUSATE SODIUM 8.6-50 MG PO TABS
2.0000 | ORAL_TABLET | ORAL | Status: DC
  Administered 2014-07-18: 2 via ORAL
  Filled 2014-07-18 (×2): qty 2

## 2014-07-18 MED ORDER — SIMETHICONE 80 MG PO CHEW: 80.0000 mg | CHEWABLE_TABLET | ORAL | Status: DC | PRN

## 2014-07-18 MED ORDER — BENZOCAINE-MENTHOL 20-0.5 % EX AERO
1.0000 "application " | INHALATION_SPRAY | CUTANEOUS | Status: DC | PRN
  Administered 2014-07-18: 1 via TOPICAL
  Filled 2014-07-18: qty 56

## 2014-07-18 MED ORDER — DIPHENHYDRAMINE HCL 25 MG PO CAPS: 25.0000 mg | ORAL_CAPSULE | Freq: Four times a day (QID) | ORAL | Status: DC | PRN

## 2014-07-18 MED ORDER — OXYCODONE-ACETAMINOPHEN 5-325 MG PO TABS
1.0000 | ORAL_TABLET | ORAL | Status: DC | PRN
  Administered 2014-07-18 – 2014-07-19 (×5): 1 via ORAL
  Filled 2014-07-18 (×5): qty 1

## 2014-07-18 MED ORDER — WITCH HAZEL-GLYCERIN EX PADS: 1.0000 "application " | MEDICATED_PAD | CUTANEOUS | Status: DC | PRN

## 2014-07-18 MED ORDER — MEASLES, MUMPS & RUBELLA VAC ~~LOC~~ INJ
0.5000 mL | INJECTION | Freq: Once | SUBCUTANEOUS | Status: DC
  Filled 2014-07-18: qty 0.5

## 2014-07-18 MED ORDER — ONDANSETRON HCL 4 MG/2ML IJ SOLN: 4.0000 mg | INTRAMUSCULAR | Status: DC | PRN

## 2014-07-18 NOTE — Progress Notes (Signed)
Post Partum Day 1 Subjective: no complaints, up ad lib, voiding, tolerating PO and + flatus  Objective: Blood pressure 120/75, pulse 71, temperature 97.9 F (36.6 C), temperature source Oral, resp. rate 20, height 5\' 6"  (1.676 m), weight 219 lb (99.338 kg), last menstrual period 07/15/2013, unknown if currently breastfeeding.  Physical Exam:  General: alert and cooperative Lochia: appropriate Uterine Fundus: firm Incision: healing well DVT Evaluation: No evidence of DVT seen on physical exam. Negative Homan's sign. No cords or calf tenderness. No significant calf/ankle edema.   Recent Labs  07/17/14 1400 07/18/14 0606  HGB 12.0 11.2*  HCT 35.6* 32.9*    Assessment/Plan: Plan for discharge tomorrow and Circumcision prior to discharge   LOS: 1 day   Cher Egnor G 07/18/2014, 8:08 AM

## 2014-07-18 NOTE — Lactation Note (Addendum)
This note was copied from the chart of [facility]. Lactation Consultation Note  Patient Name: [name] [id number] Date: 07/18/2014 Reason for consult: Initial assessment  Visited with Mom and FOB, baby 13 hrs old.  Baby latches easily and Mom using cross cradle hold without any difficulty. Latch score 10.  Encouraged skin to skin, and feeding often on cue.  Brochure left in room, informed Mom of IP and OP lactation services available.  To call prn.    Consult Status Consult Status: Follow-up Date: 07/19/14 Follow-up type: [place] 07/18/2014, 3:49 PM

## 2014-07-18 NOTE — Anesthesia Postprocedure Evaluation (Signed)
Anesthesia Post Note  Patient: [name]  Procedure(s) Performed: * No procedures listed *  Anesthesia type: Epidural  Patient location: Mother/Baby  Post pain: Pain level controlled  Post assessment: Post-op Vital signs reviewed  Last Vitals:  Filed Vitals:   07/18/14 0615  BP: 120/75  Pulse: 71  Temp: 36.6 C  Resp: 20    Post vital signs: Reviewed  Level of consciousness: awake  Complications: No apparent anesthesia complications

## 2014-07-19 MED ORDER — OXYCODONE-ACETAMINOPHEN 7.5-325 MG PO TABS: 1.0000 | ORAL_TABLET | ORAL | Status: DC | PRN

## 2014-07-19 NOTE — Discharge Summary (Signed)
Obstetric Discharge Summary Reason for Admission: onset of labor Prenatal Procedures: none Intrapartum Procedures: spontaneous vaginal delivery Postpartum Procedures: none Complications-Operative and Postpartum: first degree perineal laceration HEMOGLOBIN  Date Value Ref Range Status  07/18/2014 11.2* 12.0 - 15.0 g/dL Final   HCT  Date Value Ref Range Status  07/18/2014 32.9* 36.0 - 46.0 % Final    Physical Exam:  General: alert Lochia: appropriate Uterine Fundus: firm Incision: healing well DVT Evaluation: No evidence of DVT seen on physical exam.  Discharge Diagnoses: Term Pregnancy-delivered  Discharge Information: Date: 07/19/2014 Activity: pelvic rest Diet: routine Medications: PNV and Percocet Condition: stable Instructions: refer to practice specific booklet Discharge to: home   Newborn Data: Live born female  Birth Weight: 8 lb 14.3 oz (4035 g) APGAR: 9, 9  Home with mother.  Ursala Cressy S 07/19/2014, 9:27 AM

## 2014-07-19 NOTE — Lactation Note (Signed)
This note was copied from the chart of [facility]. Lactation Consultation Note; Mom has baby latched to breast when I went in. Experienced BF mom reports that baby has been latching well. Cluster fed through the night. No questions at present, To call prn  Patient Name: [name] [id number] Date: 07/19/2014 Reason for consult: Follow-up assessment   Maternal Data Formula Feeding for Exclusion: No Does the patient have breastfeeding experience prior to this delivery?: Yes  Feeding Feeding Type: Breast Fed Length of feed: 5 min  LATCH Score/Interventions Latch: Grasps breast easily, tongue down, lips flanged, rhythmical sucking.  Audible Swallowing: A few with stimulation  Type of Nipple: Everted at rest and after stimulation  Comfort (Breast/Nipple): Soft / non-tender     Hold (Positioning): No assistance needed to correctly position infant at breast.  LATCH Score: 9  Lactation Tools Discussed/Used     Consult Status Consult Status: Complete    Truddie Crumble 07/19/2014, 9:12 AM

## 2014-07-24 ENCOUNTER — Inpatient Hospital Stay (HOSPITAL_COMMUNITY): Admission: RE | Admit: 2014-07-24 | Payer: 59 | Source: Ambulatory Visit

## 2014-08-08 ENCOUNTER — Ambulatory Visit (HOSPITAL_COMMUNITY)
Admission: RE | Admit: 2014-08-08 | Discharge: 2014-08-08 | Disposition: A | Discharge status: Home / Self Care | Payer: 59 | Source: Ambulatory Visit | Attending: Obstetrics and Gynecology | Admitting: Obstetrics and Gynecology

## 2014-08-08 NOTE — Lactation Note (Signed)
Lactation Consult Baby's Name: [name] Date of Birth: 07/17/2013 Pediatrician: Maisie Fus Gender: female Gestational Age: [redacted]w[redacted]d (At Birth) Birth Weight: 8 lb 14.3 oz (4035 g) Weight at Discharge: Weight: 8 lb 8.7 oz (3875 g)Date of Discharge: 07/19/2014 [facility] Weights   07/17/14 2222 07/19/14 0155  Weight: 8 lb 14.3 oz (4035 g) 8 lb 8.7 oz (3875    Mother's reason for visit:  Baby is feeding all the time and her nipples are sore Visit Type:  Feeding assessment  Consult:  Initial Lactation Consultant:  Linus Mako D  ________________________________________________________________________    ________________________________________________________________________  Mother's Name: [name] Type of delivery:   Breastfeeding Experience:  p2- nursed first baby for 14 months, was premie in NICU ________________________________________________________________________  Breastfeeding History (Post Discharge)  Frequency of breastfeeding:  q 1 hour Duration of feeding:  15- 20 min   Patient does not supplement or pump.  Infant Intake and Output Assessment  Voids:  QS in 24 hrs.  Color:  Clear yellow Stools:  QS in 24 hrs.  Color:  Yellow  ________________________________________________________________________  Maternal Breast Assessment  Breast:  Filling Nipple:  Erect  _______________________________________________________________________ Feeding Assessment/Evaluation  Initial feeding assessment:  Infant's oral assessment:  WNL  Positioning:  Cradle Left breast  LATCH documentation:  Latch:  2 = Grasps breast easily, tongue down, lips flanged, rhythmical sucking.  Audible swallowing:  2 = Spontaneous and intermittent  Type of nipple:  2 = Everted at rest and after stimulation  Comfort (Breast/Nipple):  1 = Filling, red/small blisters or bruises, mild/mod discomfort  Hold (Positioning):  1 = Assistance needed to correctly position  infant at breast and maintain latch  LATCH score:  8  Attached assessment:  Deep  Lips flanged:  Yes.    Lips untucked:  No.  Suck assessment:  Nutritive    Pre-feed weight:  4916 g  (10  lb. 13.4 oz.) Post-feed weight:  5024 g (11 lb. 1.2 oz.) Amount transferred:  108 ml  Total amount transferred:  108 ml  Mom here today due to sore nipples and baby wanting to nurse all the time. Baby goes off to sleep after about 15 min of nursing. Mom is not waiting for a wide open mouth and baby is getting on just the nipple at the beginning of the feeding. Reviewed waiting for wide open mouth and keeping the baby close to the breast throughout the feeding.Elliott needed some stimulation to continue nursing. Awake and content after nursing. Discussed baby snacking and going off to sleep. Encouraged to keep him awake and to get a good meal in. Mom has new Medela pump from [employer].Asking about flange size- reports when she started pumping for first baby in NICU her nipples got very sore. #27 flanges given to mom. No further questions at present. To call prn

## 2014-08-14 ENCOUNTER — Encounter (HOSPITAL_COMMUNITY): Payer: 59

## 2014-08-20 ENCOUNTER — Other Ambulatory Visit: Payer: Self-pay | Admitting: Obstetrics and Gynecology

## 2014-08-22 LAB — CYTOLOGY - PAP

## 2014-11-05 IMAGING — US US OB TRANSVAGINAL
1 series · 13 of 28 positions shown · non-contrast
Comparison: Prior obstetrical ultrasound 05/01/2009

CLINICAL DATA: Known right-sided ectopic pregnancy with increasing
pain. Patient had methotrexate injection on 06/10/2013. History of
prior left salpingo oophorectomy for dermoid.

EXAM:
OBSTETRIC <14 WK US AND TRANSVAGINAL OB US
TECHNIQUE: Both transabdominal and transvaginal ultrasound examinations were
performed for complete evaluation of the gestation as well as the
maternal uterus, adnexal regions, and pelvic cul-de-sac.
Transvaginal technique was performed to assess early pregnancy.

[Series 1: us ob transvaginal · 49 acquisitions, 13 frames shown]
[im 2/49]
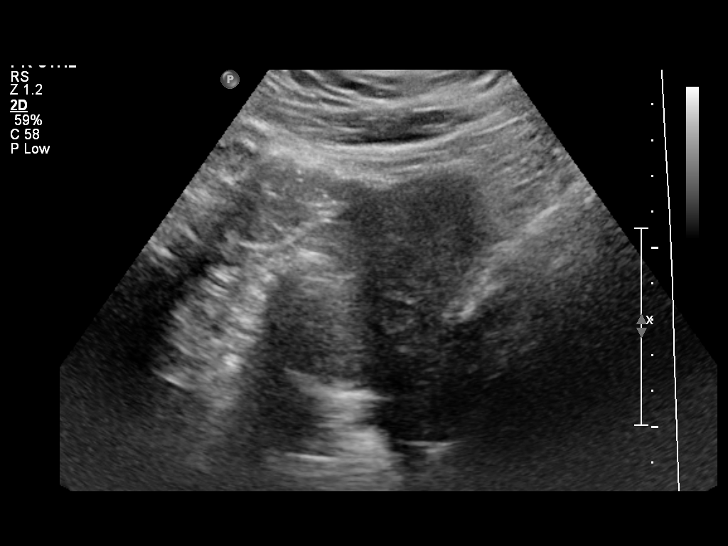
[im 6/49]
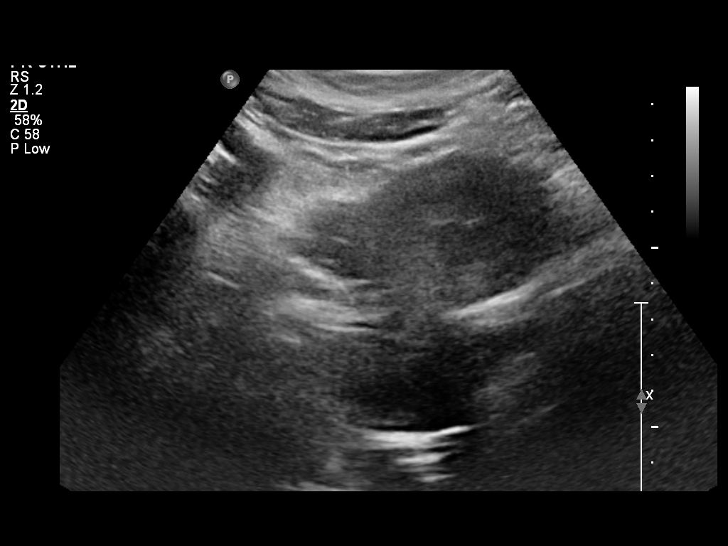
[im 9/49]
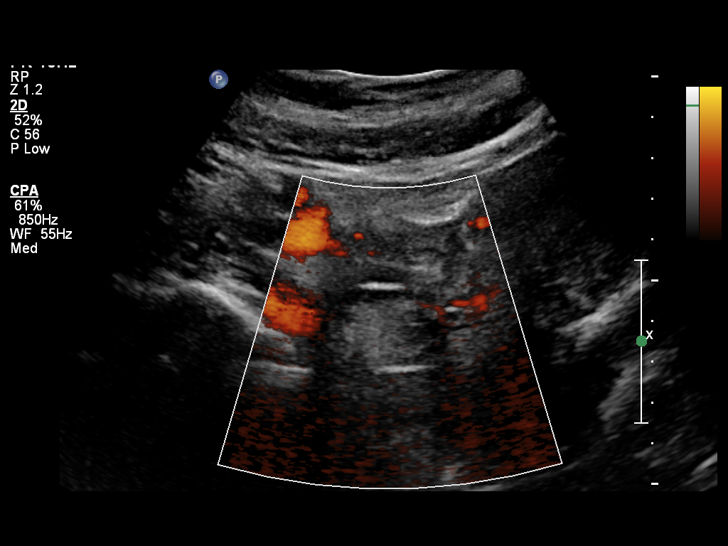
[im 13/49]
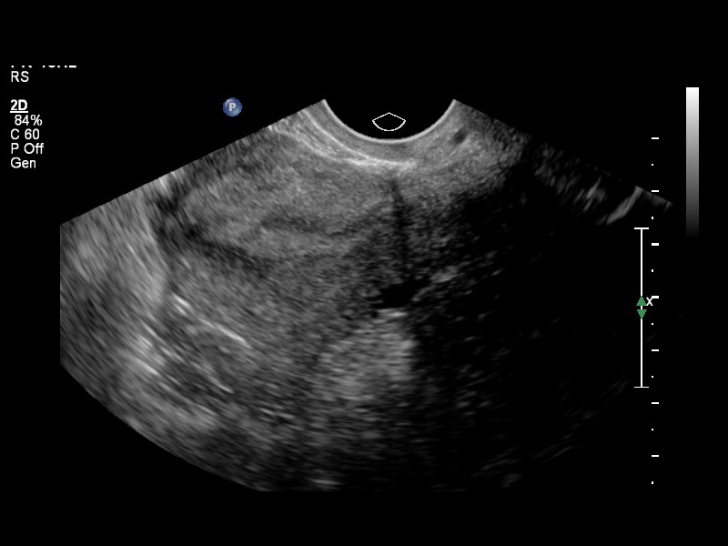
[im 17/49]
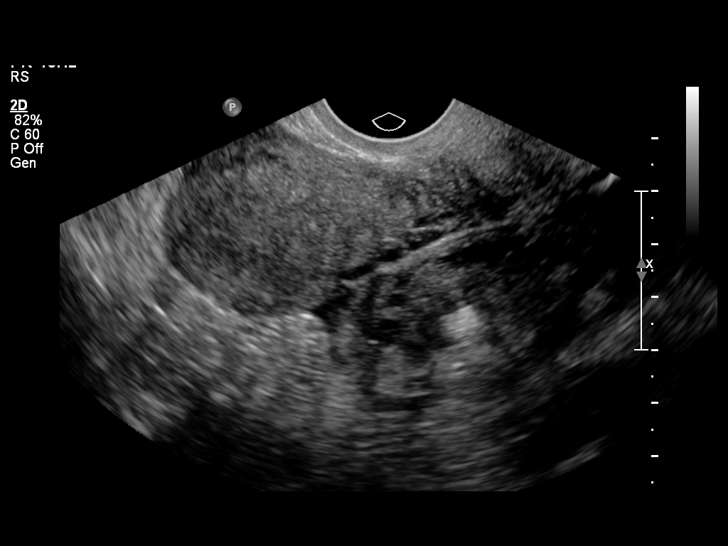
[im 20/49]
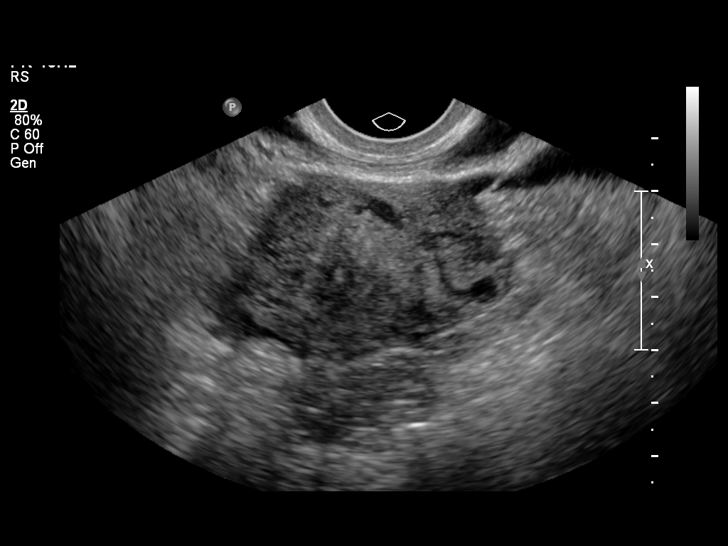
[im 25/49]
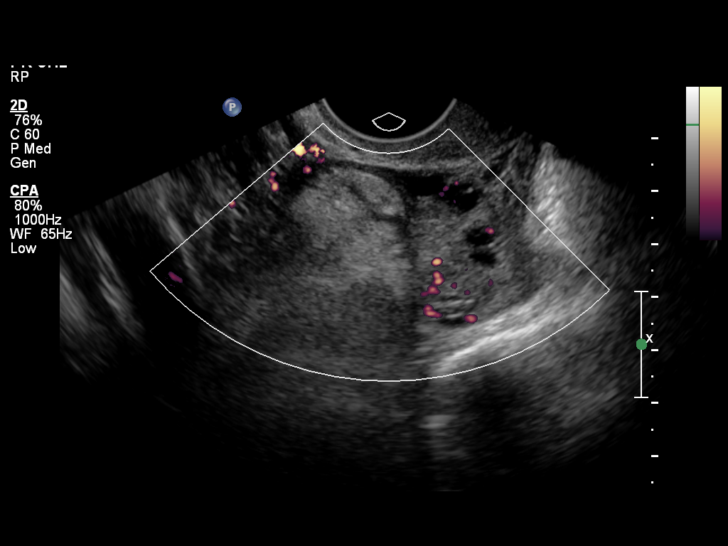
[im 29/49]
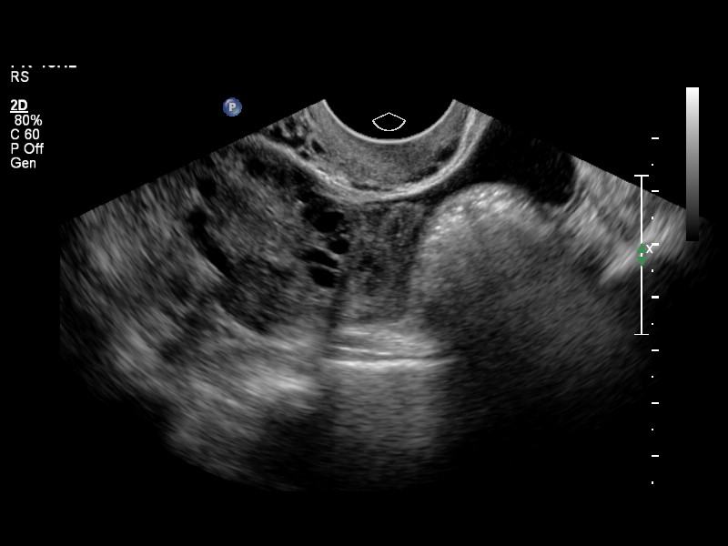
[im 33/49]
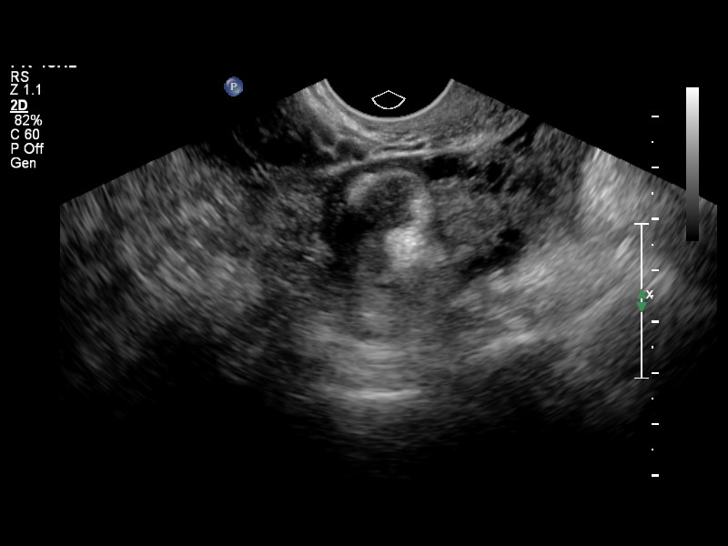
[im 36/49]
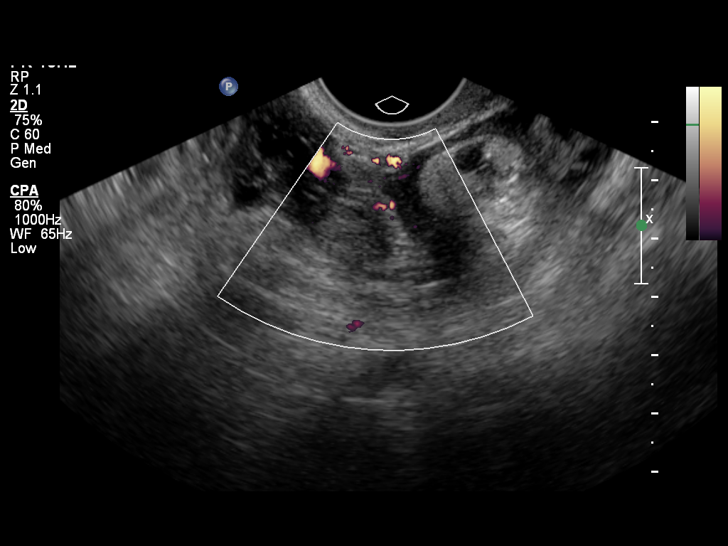
[im 40/49]
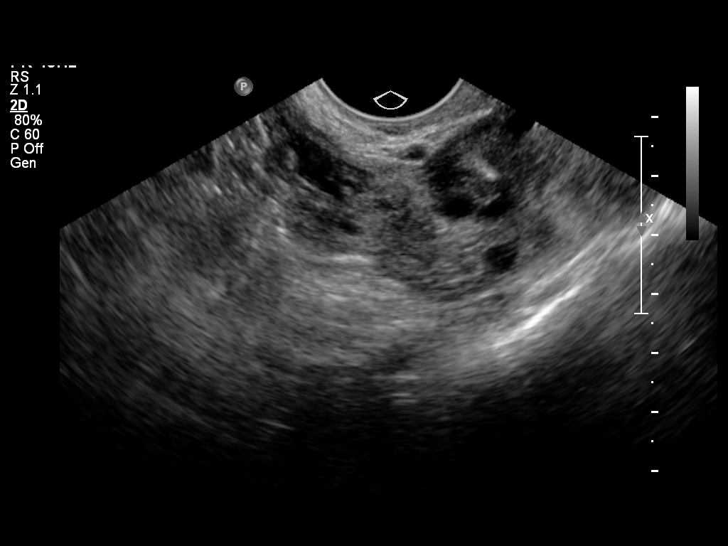
[im 43/49]
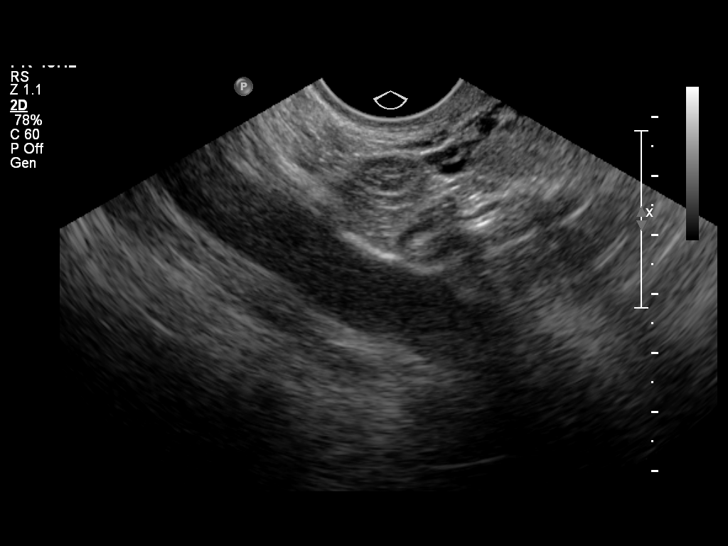
[im 47/49]
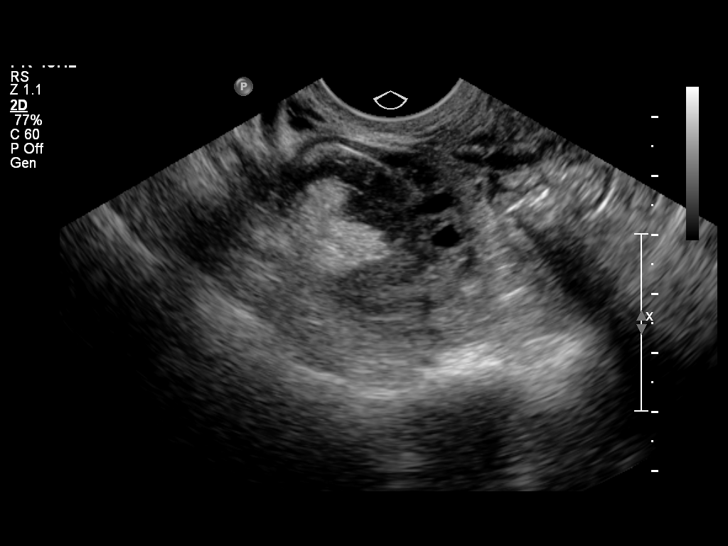

[13 of 28 positions shown; findings below may reference images not displayed]

FINDINGS: Intrauterine gestational sac: None

Yolk sac:  None

Embryo:  None

Cardiac Activity: Not applicable

Maternal uterus/adnexae: Unremarkable sonographic appearance of the
uterus. The endometrial stripe is within normal limits at 5 mm. The
left adnexa are surgically absent. There is a well-defined
heterogeneous mixed hypo and hyper echoic mass partially exophytic
from the right ovary which measures 2.4 x 2.1 x 2.4 cm. The ovary
itself measures 5.7 x 3.0 x 3.3 cm.

Inferior to the right ovary is a small amount of mobile, non
vascular complex fluid measuring 1.3 x 2.3 x 2.0 cm which may
represent a small amount of clot. Superior to the right ovary, in
the region of the at adnexa is a somewhat ill-defined 1.5 x 1.1 x
1.5 cm round soft tissue structure without internal vascularity.
IMPRESSION: 1. There is a small 1.5 x 1.1 x 1.5 cm soft tissue mass superior to
the right ovary in the region of the at adnexa which may represent
the involuting ectopic pregnancy. Recommend continued correlation
with serial beta HCG. Follow up ultrasound can also be performed if
warranted.
2. Small (1.3 x 2.3 x 2.6 cm) collection of mobile, complex fluid
inferior to the right ovary in the pelvic recess which may reflect a
small amount of hemo peritoneum.
3. Circumscribed heterogeneous 2.4 x 2.1 x 2.4 cm mass exophytic
from the right ovary suspicious for ovarian dermoid. If clinically
warranted, CT or MRI could further evaluate.

## 2014-12-14 IMAGING — RF DG HYSTEROGRAM
4 series · 4 of 4 positions shown · non-contrast
Comparison: None.

CLINICAL DATA: Previous left salpingectomy for ectopic pregnancy.
Previous methotrexate for right-sided ectopic pregnancy.

EXAM:
HYSTEROSALPINGOGRAM
TECHNIQUE: Hysterosalpingogram was performed by the ordering physician under
fluoroscopy. Fluoroscopic images were submitted for radiologic
interpretation following the procedure. Please see the procedural
report for the amount of contrast and the fluoroscopy time utilized.

[Series 1: run · 1 of 1 slices shown (1 of 4)]
[im 1/1]
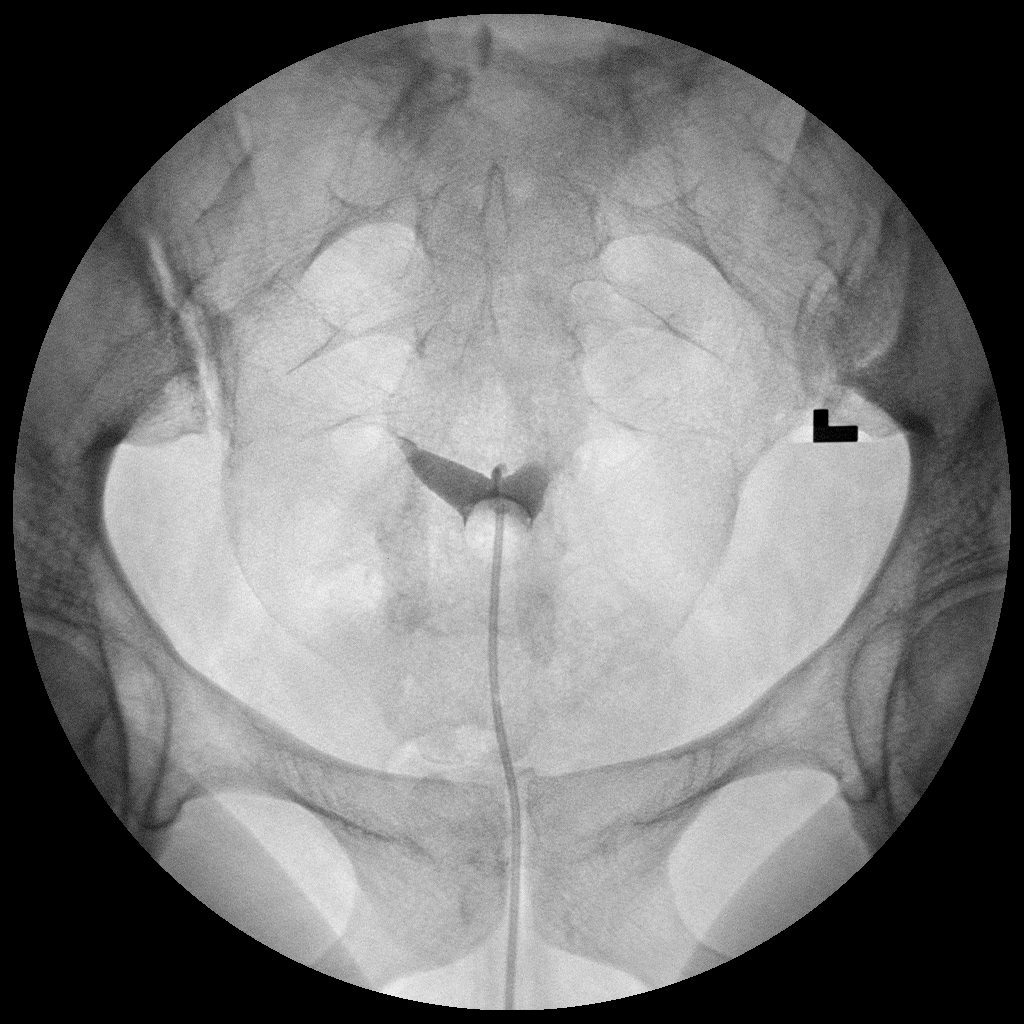

[Series 2: run · 1 of 1 slices shown (2 of 4)]
[im 1/1]
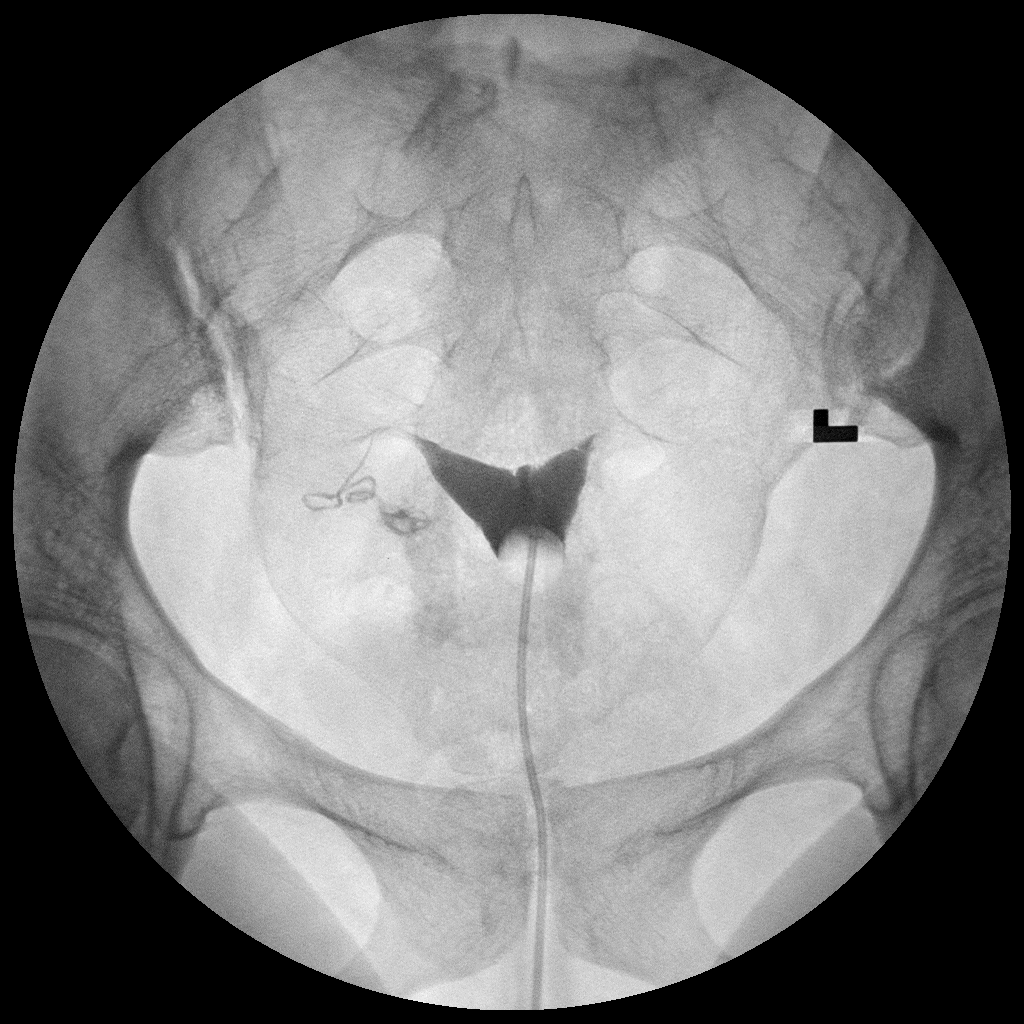

[Series 3: run · 1 of 1 slices shown (3 of 4)]
[im 1/1]
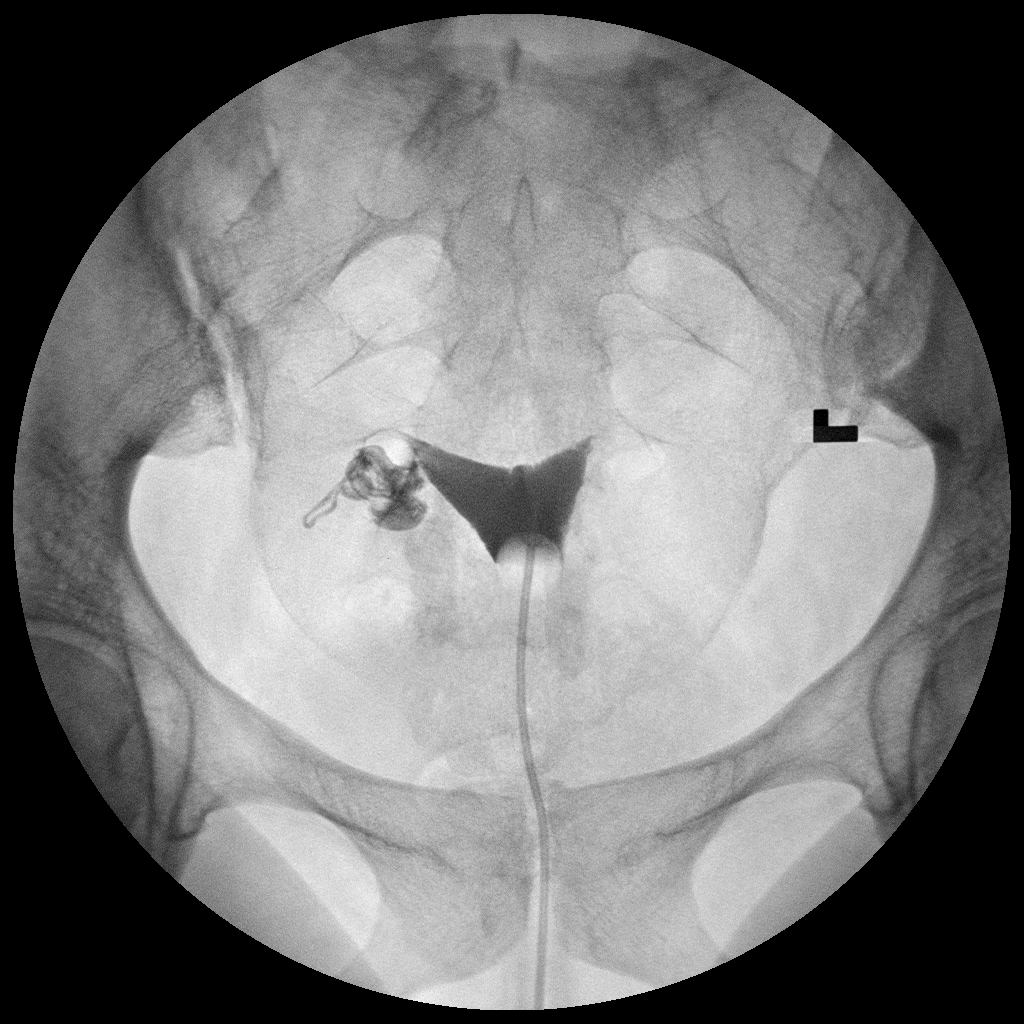

[Series 4: run · 1 of 1 slices shown (4 of 4)]
[im 1/1]
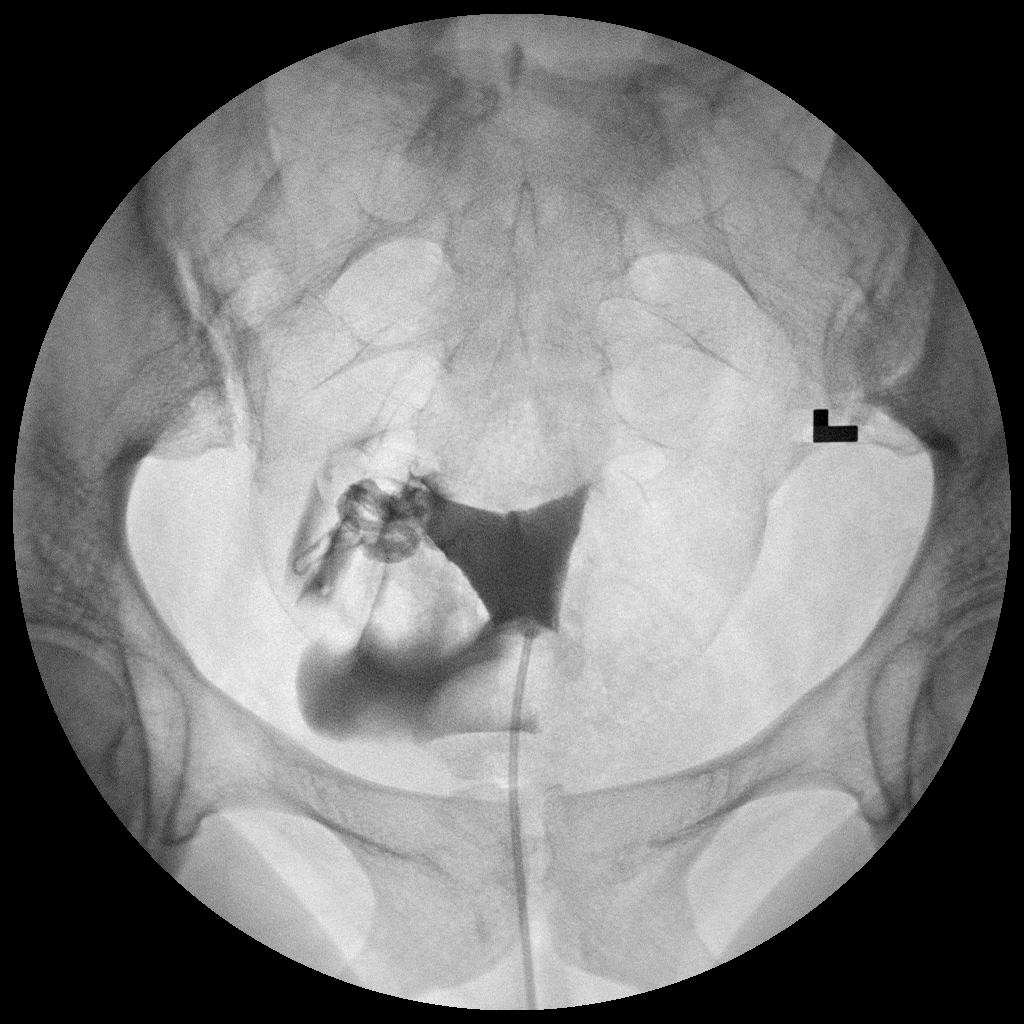

[4 of 4 positions shown; findings below may reference images not displayed]

FINDINGS: The endometrial cavity is normal in appearance and contour. No signs
of mullerian duct anomaly.

Opacification of the right fallopian tube is seen, which is normal
in appearance. Intraperitoneal spill of contrast from the right
fallopian tube is demonstrated.

Proximal occlusion of the left fallopian tube is seen, consistent
with prior left salpingectomy.
IMPRESSION: Right fallopian tube is patent.

Previous left salpingectomy.

Normal appearance of endometrial cavity.

## 2017-05-10 LAB — HM PAP SMEAR: HM Pap smear: NEGATIVE

## 2017-06-16 LAB — CEA: CEA: <0.5

## 2017-06-16 LAB — CA 125: CA 125: 15

## 2019-08-13 ENCOUNTER — Other Ambulatory Visit: Payer: Self-pay

## 2019-08-13 ENCOUNTER — Encounter: Payer: Self-pay | Admitting: Family Medicine

## 2019-08-13 ENCOUNTER — Ambulatory Visit: Payer: 59 | Admitting: Family Medicine

## 2019-08-13 VITALS — BP 128/76 | HR 58 | Temp 98.7°F | Ht 67.75 in | Wt 180.0 lb

## 2019-08-13 DIAGNOSIS — E663 Overweight: Secondary | ICD-10-CM | POA: Diagnosis not present

## 2019-08-13 DIAGNOSIS — Z8249 Family history of ischemic heart disease and other diseases of the circulatory system: Secondary | ICD-10-CM

## 2019-08-13 DIAGNOSIS — Z131 Encounter for screening for diabetes mellitus: Secondary | ICD-10-CM

## 2019-08-13 DIAGNOSIS — Z0000 Encounter for general adult medical examination without abnormal findings: Secondary | ICD-10-CM | POA: Diagnosis not present

## 2019-08-13 DIAGNOSIS — Z83438 Family history of other disorder of lipoprotein metabolism and other lipidemia: Secondary | ICD-10-CM

## 2019-08-13 DIAGNOSIS — Z13220 Encounter for screening for lipoid disorders: Secondary | ICD-10-CM | POA: Diagnosis not present

## 2019-08-13 LAB — COMPREHENSIVE METABOLIC PANEL
ALT: 15 U/L (ref 0–35)
AST: 16 U/L (ref 0–37)
Albumin: 4.5 g/dL (ref 3.5–5.2)
Alkaline Phosphatase: 40 U/L (ref 39–117)
BUN: 12 mg/dL (ref 6–23)
CO2: 27 mEq/L (ref 19–32)
Calcium: 9.4 mg/dL (ref 8.4–10.5)
Chloride: 104 mEq/L (ref 96–112)
Creatinine, Ser: 0.94 mg/dL (ref 0.40–1.20)
GFR: 66.85 mL/min (ref >60.00)
Glucose, Bld: 98 mg/dL (ref 70–99)
Potassium: 4.2 mEq/L (ref 3.5–5.1)
Sodium: 138 mEq/L (ref 135–145)
Total Bilirubin: 0.4 mg/dL (ref 0.2–1.2)
Total Protein: 7.3 g/dL (ref 6.0–8.3)

## 2019-08-13 LAB — LIPID PANEL
Cholesterol: 172 mg/dL (ref 0–200)
HDL: 40.5 mg/dL (ref >39.00)
LDL Cholesterol: 105 mg/dL — ABNORMAL HIGH (ref 0–99)
NonHDL: 131.94
Total CHOL/HDL Ratio: 4
Triglycerides: 135 mg/dL (ref 0.0–149.0)
VLDL: 27 mg/dL (ref 0.0–40.0)

## 2019-08-13 LAB — HEMOGLOBIN A1C: Hgb A1c MFr Bld: 5.1 % (ref 4.6–6.5)

## 2019-08-13 NOTE — Progress Notes (Signed)
Subjective:     [name] is a 39 y.o. female presenting for Establish Care (no previous PCP but seen Dr Julien Girt for GYN)     HPI  #overweight - has lost 10 lbs this month - reducing sweets  #family hx of MI  -   Last Pap smear - was last year - seeing GYN next month  Birth control - just switched birth control due to cost - has had some break through bleeding   Review of Systems  Constitutional: Negative for chills and fever.  HENT: Negative for congestion and sore throat.   Respiratory: Negative for shortness of breath.   Cardiovascular: Negative for chest pain.  Gastrointestinal: Negative for nausea and vomiting.  Genitourinary: Negative.   Musculoskeletal: Negative.  Negative for myalgias.  Skin: Negative for rash.  Neurological: Negative for dizziness and headaches.  Hematological: Does not bruise/bleed easily.  Psychiatric/Behavioral: The patient is not nervous/anxious.    I have reviewed the patients PMH, PSH, FmHx, Social Hx, medications and allergies and they are updated in Epic.    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 [place]: 2 children - [name] 2010 and [name] 2016, parents nearby - ok relationship       Enjoys: running, yoga, reading, being a mom      Exercise: running, yoga - training for a race 4 days a week currently   Diet: pretty good - avoiding      Safety   Seat belts: Yes    Guns: Yes  and secure   Safe in relationships: Yes         Objective:    BP Readings from Last 3 Encounters:  08/13/19 128/76  07/19/14 124/76  07/16/14 120/76   Wt Readings from Last 3 Encounters:  08/13/19 180 lb (81.6 kg)  07/17/14 219 lb (99.3 kg)  07/16/14 219 lb (99.3 kg)    BP 128/76   Pulse (!) 58   Temp 98.7  F (37.1 C)   Ht 5' 7.75" (1.721 m)   Wt 180 lb (81.6 kg)   LMP 08/13/2019   SpO2 99%   Breastfeeding No   BMI 27.57 kg/m    Physical Exam Constitutional:      General: She is not in acute distress.    Appearance: She is well-developed. She is not diaphoretic.  HENT:     Head: Normocephalic and atraumatic.     Right Ear: External ear normal.     Left Ear: External ear normal.     Nose: Nose normal.  Eyes:     General: No scleral icterus.    Conjunctiva/sclera: Conjunctivae normal.  Cardiovascular:     Rate and Rhythm: Normal rate and regular rhythm.     Heart sounds: No murmur.  Pulmonary:     Effort: Pulmonary effort is normal. No respiratory distress.     Breath sounds: Normal breath sounds. No wheezing.  Abdominal:     General: Bowel sounds are normal. There is no distension.     Palpations: Abdomen is soft. There is no mass.     Tenderness: There is no abdominal tenderness. There is no guarding or rebound.  Musculoskeletal:        General:  Normal range of motion.     Cervical back: Neck supple.  Lymphadenopathy:     Cervical: No cervical adenopathy.  Skin:    General: Skin is warm and dry.     Capillary Refill: Capillary refill takes less than 2 seconds.  Neurological:     Mental Status: She is alert and oriented to person, place, and time.     Deep Tendon Reflexes: Reflexes normal.  Psychiatric:        Behavior: Behavior normal.           Assessment & Plan:   Problem List Items Addressed This Visit    None    Visit Diagnoses    Annual physical exam    -  Primary   Family history of MI (myocardial infarction)       Relevant Orders   Lipid panel   Comprehensive metabolic panel   Hemoglobin A1c   Screening for hyperlipidemia       Relevant Orders   Lipid panel   Overweight (BMI 25.0-29.9)       Relevant Orders   Comprehensive metabolic panel   Family history of hyperlipidemia       Relevant Orders   Lipid panel   Screening for diabetes  mellitus       Relevant Orders   Hemoglobin A1c     Overweight - screening labs, already working on weight loss Encouraged regular vision/dental care Up to date on flu Will get records for Tdap Sees GYN for pap and has appointment next week Encouraged health eating/exercise   Return in about 1 year (around 08/12/2020).  Barbara Noe, MD

## 2019-08-13 NOTE — Patient Instructions (Signed)
Preventive Care 21-39 Years Old, Female Preventive care refers to visits with your health care provider and lifestyle choices that can promote health and wellness. This includes:  A yearly physical exam. This may also be called an annual well check.  Regular dental visits and eye exams.  Immunizations.  Screening for certain conditions.  Healthy lifestyle choices, such as eating a healthy diet, getting regular exercise, not using drugs or products that contain nicotine and tobacco, and limiting alcohol use. What can I expect for my preventive care visit? Physical exam Your health care provider will check your:  Height and weight. This may be used to calculate body mass index (BMI), which tells if you are at a healthy weight.  Heart rate and blood pressure.  Skin for abnormal spots. Counseling Your health care provider may ask you questions about your:  Alcohol, tobacco, and drug use.  Emotional well-being.  Home and relationship well-being.  Sexual activity.  Eating habits.  Work and work environment.  Method of birth control.  Menstrual cycle.  Pregnancy history. What immunizations do I need?  Influenza (flu) vaccine  This is recommended every year. Tetanus, diphtheria, and pertussis (Tdap) vaccine  You may need a Td booster every 10 years. Varicella (chickenpox) vaccine  You may need this if you have not been vaccinated. Human papillomavirus (HPV) vaccine  If recommended by your health care provider, you may need three doses over 6 months. Measles, mumps, and rubella (MMR) vaccine  You may need at least one dose of MMR. You may also need a second dose. Meningococcal conjugate (MenACWY) vaccine  One dose is recommended if you are age 19-21 years and a first-year college student living in a residence hall, or if you have one of several medical conditions. You may also need additional booster doses. Pneumococcal conjugate (PCV13) vaccine  You may need  this if you have certain conditions and were not previously vaccinated. Pneumococcal polysaccharide (PPSV23) vaccine  You may need one or two doses if you smoke cigarettes or if you have certain conditions. Hepatitis A vaccine  You may need this if you have certain conditions or if you travel or work in places where you may be exposed to hepatitis A. Hepatitis B vaccine  You may need this if you have certain conditions or if you travel or work in places where you may be exposed to hepatitis B. Haemophilus influenzae type b (Hib) vaccine  You may need this if you have certain conditions. You may receive vaccines as individual doses or as more than one vaccine together in one shot (combination vaccines). Talk with your health care provider about the risks and benefits of combination vaccines. What tests do I need?  Blood tests  Lipid and cholesterol levels. These may be checked every 5 years starting at age 20.  Hepatitis C test.  Hepatitis B test. Screening  Diabetes screening. This is done by checking your blood sugar (glucose) after you have not eaten for a while (fasting).  Sexually transmitted disease (STD) testing.  BRCA-related cancer screening. This may be done if you have a family history of breast, ovarian, tubal, or peritoneal cancers.  Pelvic exam and Pap test. This may be done every 3 years starting at age 21. Starting at age 30, this may be done every 5 years if you have a Pap test in combination with an HPV test. Talk with your health care provider about your test results, treatment options, and if necessary, the need for more tests.   Follow these instructions at home: Eating and drinking   Eat a diet that includes fresh fruits and vegetables, whole grains, lean protein, and low-fat dairy.  Take vitamin and mineral supplements as recommended by your health care provider.  Do not drink alcohol if: ? Your health care provider tells you not to drink. ? You are  pregnant, may be pregnant, or are planning to become pregnant.  If you drink alcohol: ? Limit how much you have to 0-1 drink a day. ? Be aware of how much alcohol is in your drink. In the [place] one drink equals one 12 oz bottle of beer (355 mL), one 5 oz glass of wine (148 mL), or one 1 oz glass of hard liquor (44 mL). Lifestyle  Take daily care of your teeth and gums.  Stay active. Exercise for at least 30 minutes on 5 or more days each week.  Do not use any products that contain nicotine or tobacco, such as cigarettes, e-cigarettes, and chewing tobacco. If you need help quitting, ask your health care provider.  If you are sexually active, practice safe sex. Use a condom or other form of birth control (contraception) in order to prevent pregnancy and STIs (sexually transmitted infections). If you plan to become pregnant, see your health care provider for a preconception visit. What's next?  Visit your health care provider once a year for a well check visit.  Ask your health care provider how often you should have your eyes and teeth checked.  Stay up to date on all vaccines. This information is not intended to replace advice given to you by your health care provider. Make sure you discuss any questions you have with your health care provider. Document Revised: 03/08/2018 Document Reviewed: 03/08/2018 Elsevier Patient Education  2020 [employer].

## 2019-08-20 ENCOUNTER — Encounter: Payer: Self-pay | Admitting: Obstetrics and Gynecology

## 2019-09-10 LAB — HM PAP SMEAR: HM Pap smear: NEGATIVE

## 2019-09-10 LAB — RESULTS CONSOLE HPV: CHL HPV: NEGATIVE

## 2021-08-09 ENCOUNTER — Ambulatory Visit: Payer: 59 | Admitting: Family Medicine

## 2021-08-09 ENCOUNTER — Encounter: Payer: Self-pay | Admitting: Family Medicine

## 2021-08-09 ENCOUNTER — Other Ambulatory Visit: Payer: Self-pay

## 2021-08-09 VITALS — BP 120/84 | HR 79 | Temp 97.6°F | Ht 68.0 in | Wt 192.2 lb

## 2021-08-09 DIAGNOSIS — N12 Tubulo-interstitial nephritis, not specified as acute or chronic: Secondary | ICD-10-CM | POA: Diagnosis not present

## 2021-08-09 LAB — POC URINALSYSI DIPSTICK (AUTOMATED)
Bilirubin, UA: NEGATIVE
Glucose, UA: NEGATIVE
Ketones, UA: NEGATIVE
Nitrite, UA: NEGATIVE
Protein, UA: NEGATIVE
Spec Grav, UA: 1.01 (ref 1.010–1.025)
Urobilinogen, UA: 0.2 E.U./dL
pH, UA: 6 (ref 5.0–8.0)

## 2021-08-09 MED ORDER — CIPROFLOXACIN HCL 500 MG PO TABS: 500.0000 mg | ORAL_TABLET | Freq: Two times a day (BID) | ORAL | 0 refills | Status: AC

## 2021-08-09 NOTE — Addendum Note (Signed)
Addended by: Carter Kitten on: 08/09/2021 02:33 PM   Modules accepted: Orders

## 2021-08-09 NOTE — Progress Notes (Signed)
Barbara Toso T. Barbara Mcculloh, MD, [place] at [facility] [place], [address]  Phone: [phone]   FAX: [name] - 41 y.o. female   MRN [id number]   Date of Birth: 06-Nov-1980  Date: 08/09/2021   PCP: [name], MD   Referral: [name], MD  Chief Complaint  Patient presents with   Flank Pain    Right   Urinary Frequency    But doesn't feel likes she empties her bladder when she goes to bathroom   Back Pain    This visit occurred during the SARS-CoV-2 public health emergency.  Safety protocols were in place, including screening questions prior to the visit, additional usage of staff PPE, and extensive cleaning of exam room while observing appropriate contact time as indicated for disinfecting solutions.   Subjective:   [name] is a 41 y.o. very pleasant female patient with Body mass index is 29.23 kg/m. who presents with the following:  Is a very pleasant young lady, she presents with some ongoing back pain.  She has a sensation of needing to go to the bathroom/urination.  ? UTI and felt like she needed to urinate and now with some pain in the back and now has urinate.   Normal BM  Some nauseated. LMP 2 weeks ago.  No STI exposures  While she has had some back pain and flank pain, this is been on the right, and she does not describe any focal musculoskeletal pain, pain in the posterior pelvis, radicular pain.  No significant thigh pain. She does have some hypogastric pain, but otherwise no significant abdominal pain right now.  She did have some right-sided back and flank pain which at this point has resolved.  Blood and leuks    Review of Systems is noted in the HPI, as appropriate  Objective:   BP 120/84    Pulse 79    Temp 97.6 F (36.4 C) (Temporal)    Ht 5\' 8"  (1.727 m)    Wt 192 lb 4 oz (87.2 kg)    LMP 07/28/2021    SpO2 100%    BMI 29.23 kg/m   GEN: No acute  distress; alert,appropriate. PULM: Breathing comfortably in no respiratory distress PSYCH: Normally interactive.  ABD: S, hypogastric tenderness, ND, + BS, No rebound, No HSM  She does have some CVA tenderness on the right Full range of motion at the back in all directions.  Nontender with decompression of the obliques, ribs, and nontender from L1-S1 in the paraspinous musculature.  Laboratory and Imaging Data: 2+ LE and 1+ blood on UA  Assessment and Plan:     ICD-10-CM   1. Pyelonephritis  N12      With history and examination, most concerning for early pyelonephritis.  I do not think that this is musculoskeletal.  Treat as such.    Meds ordered this encounter  Medications   ciprofloxacin (CIPRO) 500 MG tablet    Sig: Take 1 tablet (500 mg total) by mouth 2 (two) times daily for 7 days.    Dispense:  14 tablet    Refill:  0   Dr[employer] One speech-to-text software was used for transcription in this dictation.  Possible transcriptional errors can occur using Editor, commissioning.   Signed,  Maud Deed. Kate Larock, MD   Outpatient Encounter Medications as of 08/09/2021  Medication Sig   ciprofloxacin (CIPRO) 500 MG tablet Take 1 tablet (500  mg total) by mouth 2 (two) times daily for 7 days.   JUNEL FE 1/20 1-20 MG-MCG tablet Take 1 tablet by mouth daily.   Multiple Vitamin (MULTIVITAMIN) tablet Take 1 tablet by mouth daily.   spironolactone (ALDACTONE) 25 MG tablet Take 25 mg by mouth daily.   No facility-administered encounter medications on file as of 08/09/2021.

## 2021-08-12 LAB — URINE CULTURE
MICRO NUMBER:: 12937139
SPECIMEN QUALITY:: ADEQUATE

## 2022-02-07 ENCOUNTER — Ambulatory Visit (INDEPENDENT_AMBULATORY_CARE_PROVIDER_SITE_OTHER): Payer: 59 | Admitting: Family Medicine

## 2022-02-07 ENCOUNTER — Encounter: Payer: Self-pay | Admitting: *Deleted

## 2022-02-07 VITALS — BP 120/80 | HR 65 | Temp 97.5°F | Ht 68.0 in | Wt 193.0 lb

## 2022-02-07 DIAGNOSIS — E782 Mixed hyperlipidemia: Secondary | ICD-10-CM

## 2022-02-07 DIAGNOSIS — Z8669 Personal history of other diseases of the nervous system and sense organs: Secondary | ICD-10-CM | POA: Diagnosis not present

## 2022-02-07 DIAGNOSIS — L732 Hidradenitis suppurativa: Secondary | ICD-10-CM

## 2022-02-07 DIAGNOSIS — Z0000 Encounter for general adult medical examination without abnormal findings: Secondary | ICD-10-CM

## 2022-02-07 LAB — COMPREHENSIVE METABOLIC PANEL
ALT: 17 U/L (ref 0–35)
AST: 17 U/L (ref 0–37)
Albumin: 4.3 g/dL (ref 3.5–5.2)
Alkaline Phosphatase: 43 U/L (ref 39–117)
BUN: 11 mg/dL (ref 6–23)
CO2: 24 mEq/L (ref 19–32)
Calcium: 9.2 mg/dL (ref 8.4–10.5)
Chloride: 104 mEq/L (ref 96–112)
Creatinine, Ser: 1.04 mg/dL (ref 0.40–1.20)
GFR: 67.5 mL/min (ref >60.00)
Glucose, Bld: 94 mg/dL (ref 70–99)
Potassium: 4.1 mEq/L (ref 3.5–5.1)
Sodium: 137 mEq/L (ref 135–145)
Total Bilirubin: 0.4 mg/dL (ref 0.2–1.2)
Total Protein: 7.5 g/dL (ref 6.0–8.3)

## 2022-02-07 LAB — CBC
HCT: 39.9 % (ref 36.0–46.0)
Hemoglobin: 13.6 g/dL (ref 12.0–15.0)
MCHC: 34.1 g/dL (ref 30.0–36.0)
MCV: 90.1 fl (ref 78.0–100.0)
Platelets: 244 10*3/uL (ref 150.0–400.0)
RBC: 4.43 Mil/uL (ref 3.87–5.11)
RDW: 13.2 % (ref 11.5–15.5)
WBC: 4.9 10*3/uL (ref 4.0–10.5)

## 2022-02-07 LAB — LIPID PANEL
Cholesterol: 184 mg/dL (ref 0–200)
HDL: 50.6 mg/dL (ref >39.00)
NonHDL: 133.76
Total CHOL/HDL Ratio: 4
Triglycerides: 206 mg/dL — ABNORMAL HIGH (ref 0.0–149.0)
VLDL: 41.2 mg/dL — ABNORMAL HIGH (ref 0.0–40.0)

## 2022-02-07 LAB — LDL CHOLESTEROL, DIRECT: Direct LDL: 107 mg/dL

## 2022-02-07 NOTE — Progress Notes (Signed)
Annual Exam   Chief Complaint:  Chief Complaint  Patient presents with   Annual Exam    No concerns. [facility]    History of Present Illness:  [name] is a 41 y.o. [id number] who LMP was Patient's last menstrual period was 01/10/2022 (exact date)., presents today for her annual examination.      Nutrition/Lifestyle Diet: so, so, has gained some weight, trying to eat healthy Exercise: walking and running She does get adequate calcium and Vitamin D in her diet.  Social History   Tobacco Use  Smoking Status Never  Smokeless Tobacco Never   Social History   Substance and Sexual Activity  Alcohol Use Yes   Comment: 2 drinks a month   Social History   Substance and Sexual Activity  Drug Use No     Safety The patient wears seatbelts: yes.     The patient feels safe at home and in their relationships: yes.  General Health Dentist in the last year: Yes Eye doctor: yes  Menstrual Her menses are normal, no concerns  GYN She is single partner, contraception - OCP (estrogen/progesterone).     Cervical Cancer Screening (Age 57-65) Last Pap:  2023  Family History of Breast Cancer: no Family History of Ovarian Cancer: no    Weight Wt Readings from Last 3 Encounters:  02/07/22 193 lb (87.5 kg)  08/09/21 192 lb 4 oz (87.2 kg)  08/13/19 180 lb (81.6 kg)   Patient has high BMI  BMI Readings from Last 1 Encounters:  02/07/22 29.35 kg/m     Chronic disease screening Blood pressure monitoring:  BP Readings from Last 3 Encounters:  02/07/22 120/80  08/09/21 120/84  08/13/19 128/76     Lipid Monitoring: Indication for screening: age >52, obesity, diabetes, family hx, CV risk factors.  Lipid screening: Not Indicated  Lab Results  Component Value Date   CHOL 172 08/13/2019   HDL 40.50 08/13/2019   LDLCALC 105 (H) 08/13/2019   TRIG 135.0 08/13/2019   CHOLHDL 4 08/13/2019     Diabetes Screening: age >68, overweight, family  hx, PCOS, hx of gestational diabetes, at risk ethnicity, elevated blood pressure >135/80.  Diabetes Screening screening: Not Indicated  Lab Results  Component Value Date   HGBA1C 5.1 08/13/2019      Past Medical History:  Diagnosis Date   Acne    Hidradenitis suppurativa    History of chicken pox    Shingles     Past Surgical History:  Procedure Laterality Date   ADENOIDECTOMY     CESAREAN SECTION     05/2009   dermoid and ovary removal  2013   LAPAROSCOPY  11/08/2011   Procedure: LAPAROSCOPY DIAGNOSTIC;  Surgeon: Marylynn Pearson, MD;  Location: [facility];  Service: Gynecology;  Laterality: N/A;   LAPAROTOMY  11/08/2011   Procedure: LAPAROTOMY;  Surgeon: Marylynn Pearson, MD;  Location: [facility];  Service: Gynecology;  Laterality: N/A;   MANDIBLE SURGERY     OVARIAN CYST REMOVAL  11/08/2011   Procedure: OVARIAN CYSTECTOMY;  Surgeon: Marylynn Pearson, MD;  Location: [facility];  Service: Gynecology;  Laterality: Left;   tubes in ears     multiple as child    Prior to Admission medications   Medication Sig Start Date End Date Taking? Authorizing Provider  JUNEL FE 1/20 1-20 MG-MCG tablet Take 1 tablet by mouth daily. 07/30/19  Yes [provider]  Multiple Vitamin (MULTIVITAMIN) tablet Take 1 tablet by mouth  daily.   Yes [provider]  spironolactone (ALDACTONE) 25 MG tablet Take 25 mg by mouth daily.   Yes [provider]    No Known Allergies  Gynecologic History: Patient's last menstrual period was 01/10/2022 (exact date).  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 [place]      Family: 2 children - [name] 2010 and [name] 2016, parents nearby - ok relationship       Enjoys: running, yoga, reading, being a mom      Exercise: running, yoga - training for a race 4 days a week currently   Diet: pretty good - avoiding      Safety   Seat belts: Yes    Guns: Yes  and secure   Safe in relationships: Yes    Social Determinants of Health   Financial Resource Strain: Not on file  Food Insecurity: Not on file  Transportation Needs: Not on file  Physical Activity: Not on file  Stress: Not on file  Social Connections: Not on file  Intimate Partner Violence: Not on file    Family History  Problem Relation Age of Onset   Hearing loss Father    Arthritis Mother    Multiple sclerosis Mother    Hypertension Brother    Heart disease Paternal Grandmother    Hyperlipidemia Paternal Grandmother    Hypertension Paternal Grandmother    Hyperlipidemia Paternal Grandfather    Hypertension Paternal Grandfather    Heart attack Paternal Aunt 57    Review of Systems  Constitutional:  Negative for chills and fever.  HENT:  Negative for congestion and sore throat.   Eyes:  Negative for blurred vision and double vision.  Respiratory:  Negative for shortness of breath.   Cardiovascular:  Negative for chest pain.  Gastrointestinal:  Negative for heartburn, nausea and vomiting.  Genitourinary: Negative.   Musculoskeletal: Negative.  Negative for myalgias.  Skin:  Negative for rash.  Neurological:  Negative for dizziness and headaches.  Endo/Heme/Allergies:  Does not bruise/bleed easily.  Psychiatric/Behavioral:  Negative for depression. The patient is not nervous/anxious.      Physical Exam BP 120/80   Pulse 65   Temp (!) 97.5 F (36.4 C)  (Temporal)   Ht '5\' 8"'$  (1.727 m)   Wt 193 lb (87.5 kg)   LMP 01/10/2022 (Exact Date)   SpO2 98%   BMI 29.35 kg/m    BP Readings from Last 3 Encounters:  02/07/22 120/80  08/09/21 120/84  08/13/19 128/76    Wt Readings from Last 3 Encounters:  02/07/22 193 lb (87.5 kg)  08/09/21 192 lb 4 oz (87.2 kg)  08/13/19 180 lb (81.6 kg)     Physical Exam Constitutional:      General: She is not in  acute distress.    Appearance: She is well-developed. She is not diaphoretic.  HENT:     Head: Normocephalic and atraumatic.     Right Ear: External ear normal.     Left Ear: External ear normal.     Nose: Nose normal.  Eyes:     General: No scleral icterus.    Extraocular Movements: Extraocular movements intact.     Conjunctiva/sclera: Conjunctivae normal.  Cardiovascular:     Rate and Rhythm: Normal rate and regular rhythm.     Heart sounds: No murmur heard. Pulmonary:     Effort: Pulmonary effort is normal. No respiratory distress.     Breath sounds: Normal breath sounds. No wheezing.  Abdominal:     General: Bowel sounds are normal. There is no distension.     Palpations: Abdomen is soft. There is no mass.     Tenderness: There is no abdominal tenderness. There is no guarding or rebound.  Musculoskeletal:        General: Normal range of motion.     Cervical back: Neck supple.  Lymphadenopathy:     Cervical: No cervical adenopathy.  Skin:    General: Skin is warm and dry.     Capillary Refill: Capillary refill takes less than 2 seconds.  Neurological:     Mental Status: She is alert and oriented to person, place, and time.     Deep Tendon Reflexes: Reflexes normal.  Psychiatric:        Mood and Affect: Mood normal.        Behavior: Behavior normal.       Results:    02/07/2022    9:48 AM 08/13/2019   10:31 AM  Depression screen PHQ 2/9  Decreased Interest 1 0  Down, Depressed, Hopeless 1 0  PHQ - 2 Score 2 0  Altered sleeping 1   Tired, decreased energy 1    Change in appetite 1   Feeling bad or failure about yourself  1   Trouble concentrating 0   Moving slowly or fidgety/restless 0   Suicidal thoughts 0   PHQ-9 Score 6   Difficult doing work/chores Somewhat difficult       Assessment: 41 y.o. H7C1638 female here for routine annual examination.  Plan: Problem List Items Addressed This Visit       Musculoskeletal and Integument   Hidradenitis suppurativa   Relevant Orders   Comprehensive metabolic panel   Other Visit Diagnoses     Annual physical exam    -  Primary   Relevant Orders   CBC   Mixed hyperlipidemia       Relevant Orders   Lipid panel   History of recurrent ear infection       Relevant Orders   Ambulatory referral to ENT        Screening: -- Blood pressure screen normal -- cholesterol screening: will obtain -- Weight screening: overweight: continue to monitor -- Diabetes Screening: will obtain -- Nutrition: encouraged healthy diet   Psych -- Depression screening (PHQ-9):  [place] Office Visit from 02/07/2022 in [place] at [facility]  PHQ-9 Total Score 6        Safety -- tobacco screening: not using -- alcohol screening: \ low-risk usage. -- no evidence of domestic violence or intimate partner violence.   Cancer Screening -- pap smear not collected per ASCCP guidelines -- family history of breast cancer screening: done. not at high risk.   Immunizations Immunization History  Administered Date(s) Administered  Influenza, Seasonal, Injecte, Preservative Fre 04/11/2019   Influenza,inj,Quad PF,6+ Mos 04/15/2020, 05/24/2021   PFIZER(Purple Top)SARS-COV-2 Vaccination 04/15/2020    -- flu vaccine up to date -- TDAP q10 years up to date --- Covid-19 Vaccine up to date  Encouraged regular vision and dental screening. Encouraged healthy exercise and diet.   [name]

## 2022-02-07 NOTE — Patient Instructions (Signed)
[name] -- Nurse Practioner

## 2022-02-10 ENCOUNTER — Encounter: Payer: Self-pay | Admitting: Family Medicine

## 2022-03-08 LAB — HM MAMMOGRAPHY

## 2022-09-27 ENCOUNTER — Encounter: Payer: 59 | Admitting: Family

## 2022-12-06 ENCOUNTER — Encounter: Payer: 59 | Admitting: Family

## 2023-02-16 ENCOUNTER — Ambulatory Visit: Payer: 59 | Admitting: Family

## 2023-02-16 ENCOUNTER — Encounter: Payer: Self-pay | Admitting: Family

## 2023-02-16 VITALS — BP 120/68 | HR 80 | Temp 98.6°F | Ht 68.0 in | Wt 187.0 lb

## 2023-02-16 DIAGNOSIS — E781 Pure hyperglyceridemia: Secondary | ICD-10-CM

## 2023-02-16 DIAGNOSIS — Z1159 Encounter for screening for other viral diseases: Secondary | ICD-10-CM | POA: Diagnosis not present

## 2023-02-16 DIAGNOSIS — F419 Anxiety disorder, unspecified: Secondary | ICD-10-CM

## 2023-02-16 DIAGNOSIS — L732 Hidradenitis suppurativa: Secondary | ICD-10-CM

## 2023-02-16 DIAGNOSIS — Z0000 Encounter for general adult medical examination without abnormal findings: Secondary | ICD-10-CM | POA: Insufficient documentation

## 2023-02-16 DIAGNOSIS — M722 Plantar fascial fibromatosis: Secondary | ICD-10-CM

## 2023-02-16 LAB — BASIC METABOLIC PANEL
BUN: 13 mg/dL (ref 6–23)
CO2: 26 mEq/L (ref 19–32)
Calcium: 9.2 mg/dL (ref 8.4–10.5)
Chloride: 101 mEq/L (ref 96–112)
Creatinine, Ser: 0.95 mg/dL (ref 0.40–1.20)
GFR: 74.71 mL/min (ref >60.00)
Glucose, Bld: 94 mg/dL (ref 70–99)
Potassium: 3.9 mEq/L (ref 3.5–5.1)
Sodium: 136 mEq/L (ref 135–145)

## 2023-02-16 LAB — CBC
HCT: 42.6 % (ref 36.0–46.0)
Hemoglobin: 14.3 g/dL (ref 12.0–15.0)
MCHC: 33.6 g/dL (ref 30.0–36.0)
MCV: 89.5 fl (ref 78.0–100.0)
Platelets: 293 10*3/uL (ref 150.0–400.0)
RBC: 4.77 Mil/uL (ref 3.87–5.11)
RDW: 13 % (ref 11.5–15.5)
WBC: 6.6 10*3/uL (ref 4.0–10.5)

## 2023-02-16 LAB — LIPID PANEL
Cholesterol: 217 mg/dL — ABNORMAL HIGH (ref 0–200)
HDL: 54.7 mg/dL (ref >39.00)
LDL Cholesterol: 127 mg/dL — ABNORMAL HIGH (ref 0–99)
NonHDL: 162.52
Total CHOL/HDL Ratio: 4
Triglycerides: 179 mg/dL — ABNORMAL HIGH (ref 0.0–149.0)
VLDL: 35.8 mg/dL (ref 0.0–40.0)

## 2023-02-16 MED ORDER — HYDROXYZINE HCL 10 MG PO TABS
10.0000 mg | ORAL_TABLET | Freq: Three times a day (TID) | ORAL | 0 refills | Status: AC | PRN
Start: 2023-02-16 — End: ?

## 2023-02-16 NOTE — Assessment & Plan Note (Signed)
Stable

## 2023-02-16 NOTE — Assessment & Plan Note (Signed)
Pt pending appt with chiropractor

## 2023-02-16 NOTE — Progress Notes (Signed)
Subjective:  Patient ID: [name], female    DOB: 02-27-1981  Age: 42 y.o. MRN: [id number]  Patient Care Team: Mort Sawyers, FNP as PCP - General (Family Medicine) Zelphia Cairo, MD as Consulting Physician (Obstetrics and Gynecology)   CC:  Chief Complaint  Patient presents with   Annual Exam   Establish Care    HPI [name] is a 42 y.o. female who presents today for an annual physical exam as well as here to establish care  Prior provider  Dr. Gweneth Dimitri   .She reports consuming a general diet.  Trying to exercise but with plantar fasciitis  She generally feels well. She reports sleeping well. She does not have additional problems to discuss today.   Vision:Within last year Dental:Receives regular dental care STD:The patient denies history of sexually transmitted disease.   Mammogram: had one last year, scheduled for one again this year.  Normal mammogram per pt.   Last pap: 09/2019 has app[facility]ologist[facility]   Pt is without acute concerns.  Left sided plantar fascitis, working on stretches. Has seen podiatry in the past but was given an injection that didn't really help and is going to see a chiropractor for a consult.   Anxiety: struggles at times but only here and there.   Advanced Directives Patient does not have advanced directives    DEPRESSION SCREENING    02/16/2023   10:38 AM 02/07/2022    9:48 AM 08/13/2019   10:31 AM  PHQ 2/9 Scores  PHQ - 2 Score 2 2 0  PHQ- 9 Score 7 6      ROS: Negative unless specifically indicated above in HPI.    Current Outpatient Medications:    hydrOXYzine (ATARAX) 10 MG tablet, Take 1 tablet (10 mg total) by mouth 3 (three) times daily as needed., Disp: 30 tablet, Rfl: 0   JUNEL FE 1/20 1-20 MG-MCG tablet, Take 1 tablet by mouth daily., Disp: , Rfl:    Multiple Vitamin (MULTIVITAMIN) tablet, Take 1 tablet by mouth daily., Disp: , Rfl:     Objective:    BP  120/68   Pulse 80   Temp 98.6 F (37 C) (Temporal)   Ht 5\' 8"  (1.727 m)   Wt 187 lb (84.8 kg)   SpO2 100%   BMI 28.43 kg/m   BP Readings from Last 3 Encounters:  02/16/23 120/68  02/07/22 120/80  08/09/21 120/84      Physical Exam Constitutional:      General: She is not in acute distress.    Appearance: Normal appearance. She is obese. She is not ill-appearing.  HENT:     Head: Normocephalic.     Right Ear: Tympanic membrane normal.     Left Ear: Tympanic membrane normal.     Nose: Nose normal.     Mouth/Throat:     Mouth: Mucous membranes are moist.  Eyes:     Extraocular Movements: Extraocular movements intact.     Pupils: Pupils are equal, round, and reactive to light.  Cardiovascular:     Rate and Rhythm: Normal rate and regular rhythm.  Pulmonary:     Effort: Pulmonary effort is normal.     Breath sounds: Normal breath sounds.  Abdominal:     General: Abdomen is flat. Bowel sounds are normal.     Palpations: Abdomen is soft.     Tenderness: There is no guarding or rebound.  Musculoskeletal:  General: Normal range of motion.     Cervical back: Normal range of motion.  Skin:    General: Skin is warm.     Capillary Refill: Capillary refill takes less than 2 seconds.  Neurological:     General: No focal deficit present.     Mental Status: She is alert.  Psychiatric:        Mood and Affect: Mood normal.        Behavior: Behavior normal.        Thought Content: Thought content normal.        Judgment: Judgment normal.          Assessment & Plan:  Hidradenitis suppurativa Assessment & Plan: Stable.    Encounter for general adult medical examination without abnormal findings Assessment & Plan: Patient Counseling(The following topics were reviewed):  Preventative care handout given to pt  Health maintenance and immunizations reviewed. Please refer to Health maintenance section. Pt advised on safe sex, wearing seatbelts in car, and proper  nutrition labwork ordered today for annual Dental health: Discussed importance of regular tooth brushing, flossing, and dental visits.   Orders: -     Lipid panel -     Basic metabolic panel -     CBC  High triglycerides Assessment & Plan: Ordered lipid panel, pending results. Work on low cholesterol diet and exercise as tolerated Pt is fasting  Orders: -     Lipid panel  Encounter for hepatitis C screening test for low risk patient -     Hepatitis C antibody  Plantar fasciitis, left Assessment & Plan: Pt pending appt with chiropractor   Anxiety Assessment & Plan: Referral to psychology for eval/treat  Trial hydroxyxine 25 mg prn anxiety state   Orders: -     hydrOXYzine HCl; Take 1 tablet (10 mg total) by mouth 3 (three) times daily as needed.  Dispense: 30 tablet; Refill: 0 -     Ambulatory referral to Psychology      Follow-up: Return in about 1 year (around 02/16/2024) for f/u CPE.   Mort Sawyers, FNP

## 2023-02-16 NOTE — Assessment & Plan Note (Signed)
Referral to psychology for eval/treat  Trial hydroxyxine 25 mg prn anxiety state

## 2023-02-16 NOTE — Patient Instructions (Addendum)
  A referral was placed today for psychology. Please let us know if you have not heard back within 2 weeks about the referral.  Hydroxyxine trial for anxiety states.    Regards,   Mort Sawyers FNP-C

## 2023-02-16 NOTE — Assessment & Plan Note (Signed)
Patient Counseling(The following topics were reviewed): ? Preventative care handout given to pt  ?Health maintenance and immunizations reviewed. Please refer to Health maintenance section. ?Pt advised on safe sex, wearing seatbelts in car, and proper nutrition ?labwork ordered today for annual ?Dental health: Discussed importance of regular tooth brushing, flossing, and dental visits. ? ? ?

## 2023-02-16 NOTE — Assessment & Plan Note (Addendum)
Ordered lipid panel, pending results. Work on low cholesterol diet and exercise as tolerated Pt is fasting 

## 2023-04-19 ENCOUNTER — Ambulatory Visit (INDEPENDENT_AMBULATORY_CARE_PROVIDER_SITE_OTHER): Payer: 59 | Admitting: Clinical

## 2023-04-19 DIAGNOSIS — F411 Generalized anxiety disorder: Secondary | ICD-10-CM | POA: Diagnosis not present

## 2023-04-19 NOTE — Progress Notes (Signed)
                Dezi Schaner, LCSW 

## 2023-04-19 NOTE — Progress Notes (Signed)
[facility] - Closed Behavioral Health Counselor Initial Adult Exam  Name: [name] Date: 04/19/2023 MRN: [id number] DOB: 01-17-1981 PCP: Mort Sawyers, FNP  Time spent: 2:39pm - 3:08pm  Guardian/Payee:  NA    Paperwork requested:  NA  Reason for Visit /Presenting Problem: Patient stated, "I have some anxiety and I mentioned that in the last check up". Patient reported patient's PCP referred patient for today's appointment.   Mental Status Exam: Appearance:   Neat and Well Groomed     Behavior:  Appropriate  Motor:  Normal  Speech/Language:   Clear and Coherent  Affect:  Appropriate  Mood:  anxious  Thought process:  normal  Thought content:    WNL  Sensory/Perceptual disturbances:    WNL  Orientation:  oriented to person, place, situation, and day of week  Attention:  Good  Concentration:  Good  Memory:  WNL  Fund of knowledge:   Good  Insight:    Good  Judgment:   Good  Impulse Control:  Good   Reported Symptoms:  Patient stated "it comes and goes", "I think right now a lot of it is I work part time, I can't find a job/career that I love", "I worry all the time", "I'm very much a worrier". Patient reported muscle tension, chest pain, chest tightness, restlessness, irritability, and stated "I get angry a lot, I feel frustrated and anger". Patient stated, "I think I've had this a long time, I think I've always kind of been on edge".   Risk Assessment: Danger to Self:  No Patient denied current and past suicidal ideation or symptoms of psychosis Self-injurious Behavior: No Danger to Others: No Patient denied current and past homicidal ideation Duty to Warn:no Physical Aggression / Violence:No  Access to Firearms a concern: No  but reported access Gang Involvement:No  Patient / guardian was educated about steps to take if suicide or homicide risk level increases between visits: yes While future psychiatric events cannot be accurately predicted, the patient does not currently  require acute inpatient psychiatric care and does not currently meet [facility] involuntary commitment criteria.  Substance Abuse History: Current substance abuse: No   Patient stated, "I drink maybe on weekends, a glass or two of wine, not that often". Patient reported no current or past tobacco use or drug use.   Past Psychiatric History:   Previous psychological history is significant for couples counseling Outpatient Providers: therapy once for couple counseling History of Psych Hospitalization: No  Psychological Testing:  none    Abuse History:  Victim of: No.,  none    Report needed: No. Victim of Neglect:No. Perpetrator of  none   Witness / Exposure to Domestic Violence: No   Protective Services Involvement: No  Witness to [employer] Violence:  No   Family History:  Family History  Problem Relation Age of Onset   Arthritis Mother        mom is adopted   Multiple sclerosis Mother    Hearing loss Father    Healthy Sister    Hypertension Brother    Heart disease Paternal Grandmother    Hyperlipidemia Paternal Grandmother    Hypertension Paternal Grandmother    Hyperlipidemia Paternal Grandfather    Hypertension Paternal Grandfather    Heart attack Paternal Aunt 80    Living situation: the patient lives with their family (husband and 2 children)  Sexual Orientation: Straight  Relationship Status: married  Name of spouse / other: Thayer Ohm If a parent, number of children / ages: 2  children - daughter 29, son 8  Support Systems: spouse friends family  Surveyor, quantity Stress:  Yes   Income/Employment/Disability: working part Office manager: No   Educational History: Education: Risk manager: Patient stated, "[employer]"  Any cultural differences that may affect / interfere with treatment:  not applicable   Recreation/Hobbies: excising, going for a walk, likes to read  Stressors: Financial difficulties   Other:  communication with husband, deciding her career path in the future    Strengths: exercise, going for a walk, going to dinner with friends, date nights with husband  Barriers:  none   Legal History: Pending legal issue / charges: The patient has no significant history of legal issues. History of legal issue / charges:  none  Medical History/Surgical History: reviewed Past Medical History:  Diagnosis Date   Acne    Hidradenitis suppurativa    History of chicken pox    Shingles     Past Surgical History:  Procedure Laterality Date   ADENOIDECTOMY     CESAREAN SECTION     05/2009   LAPAROSCOPY  11/08/2011   Procedure: LAPAROSCOPY DIAGNOSTIC;  Surgeon: Zelphia Cairo, MD;  Location: [facility];  Service: Gynecology;  Laterality: N/A;   LAPAROTOMY  11/08/2011   Procedure: LAPAROTOMY;  Surgeon: Zelphia Cairo, MD;  Location: [facility];  Service: Gynecology;  Laterality: N/A;   MANDIBLE SURGERY     TMJ   OVARIAN CYST REMOVAL  11/08/2011   Procedure: OVARIAN CYSTECTOMY;  Surgeon: Zelphia Cairo, MD;  Location: [facility];  Service: Gynecology;  Laterality: Left;   tubes in ears     multiple as child    Medications: Current Outpatient Medications  Medication Sig Dispense Refill   hydrOXYzine (ATARAX) 10 MG tablet Take 1 tablet (10 mg total) by mouth 3 (three) times daily as needed. 30 tablet 0   JUNEL FE 1/20 1-20 MG-MCG tablet Take 1 tablet by mouth daily.     Multiple Vitamin (MULTIVITAMIN) tablet Take 1 tablet by mouth daily.     No current facility-administered medications for this visit.    No Known Allergies  Diagnoses:  Generalized anxiety disorder  Plan of Care: Patient is a 42 year old female who presented for an initial assessment. Clinician conducted initial assessment in person from clinician's office at [facility]. Patient reported the following symptoms: worry, muscle tension, chest pain, chest tightness, restlessness, irritability, and anger. Patient stated,  "I think I've had this a long time, I think I've always kind of been on edge". Patient denied current and past suicidal ideation, homicidal ideation, and symptoms of psychosis. Patient stated, "I drink maybe on weekends, a glass or two of wine, not that often". Patient reported no current or past tobacco use or drug use. Patient reported a history of participation in couples counseling once. Patient reported no history of psychiatric hospitalizations. Patient reported finances, communication with patient's husband, and deciding a career path are current stressors. Patient identified patient's husband, friends, and family as current supports. It is recommended patient be referred to a psychiatrist for a medication management consult and recommended patient participate in individual therapy weekly. Clinician will review recommendations and treatment plan with patient during follow up appointment. Treatment plan will be developed during follow up appointment.   Collaboration of Care: Other Patient declined to consents for other providers.   Patient/Guardian was advised Release of Information must be obtained prior to any record release in order to collaborate their care with an  outside provider. Patient/Guardian was advised if they have not already done so to contact [facility] to sign all necessary forms in order for [place] to release information regarding their care.   Doree Barthel, LCSW

## 2023-05-24 ENCOUNTER — Ambulatory Visit: Payer: 59 | Admitting: Clinical

## 2023-05-24 DIAGNOSIS — F411 Generalized anxiety disorder: Secondary | ICD-10-CM | POA: Diagnosis not present

## 2023-05-24 NOTE — Progress Notes (Addendum)
[facility] Counselor/Therapist Progress Note  Patient ID: [name], MRN: [id number]    Date: 05/24/23  Time Spent: 2:32  pm - 3:16 pm : 44 Minutes  Treatment Type: Individual Therapy.  Reported Symptoms: Patient reported worry, anger, guilt  Mental Status Exam: Appearance:  Neat and Well Groomed     Behavior: Appropriate  Motor: Normal  Speech/Language:  Clear and Coherent  Affect: Tearful  Mood: anxious  Thought process: normal  Thought content:   WNL  Sensory/Perceptual disturbances:   WNL  Orientation: oriented to person, place, and situation  Attention: Good  Concentration: Good  Memory: WNL  Fund of knowledge:  Good  Insight:   Good  Judgment:  Good  Impulse Control: Good   Risk Assessment: Danger to Self:  No Patient denied current suicidal ideation  Self-injurious Behavior: No Danger to Others: No Patient denied current homicidal ideation Duty to Warn:no Physical Aggression / Violence:No  Access to Firearms a concern: No  Gang Involvement:No   Subjective:  Patient stated, "they're fine" in response to events since last session. Patient stated, "alright" in response to mood since last session. Patient reported her son has exhibited anxiety at school this week and is experiencing "meltdowns". Patient reported son's experience at school this week has been a stressor for patient. Patient reported feeling on edge at times in anticipation of her son's mood and stated, "It just stresses me out and I worry about it". Patient stated, "coming here (therapy) stresses me out". Patient stated, "I don't know what I want to talk about" in reference to therapy sessions. Patient reported concern about having a positive experience in therapy and coordinating with husband's schedule for patient to attend therapy is a stressor. Patient stated,  "that all sounds right" in response to diagnosis. Patient stated, "I always kind of fought that a little bit" in response to  consultation for medication management. Patient reported she is open to a referral to a psychiatrist. Patient stated, "therapy's fine", "I don't know if I can do every week". Patient stated,  "I could try every other week" in response to frequency of therapy sessions. Patient stated, "I don't know" in response to goals for therapy. Patient stated, "I need some confidence in everything", "I feel angry a lot, I don't want to feel so angry all the time", " I feel like I've made poor choices in life" in response to potential goals for therapy. Patient reported feelings of guilt.   Interventions: Motivational Interviewing. Clinician conducted session in person at clinician's office at [facility]. Reviewed events since last session. Assessed patient's mood. Discussed current stressors. Assisted patient in exploring patient's thoughts/feelings regarding attending therapy sessions. Clinician reviewed diagnosis and treatment recommendations. Provided psycho education related to diagnosis and treatment. Clinician utilized motivational interviewing to explore potential goals for therapy.  Collaboration of Care: Other Discussed consent required for referral to psychiatrist  Diagnosis:  Generalized anxiety disorder   Plan: Goals to be completed during follow up appointment on 06/14/23.              Doree Barthel, LCSW

## 2023-06-14 ENCOUNTER — Ambulatory Visit: Payer: 59 | Admitting: Clinical

## 2023-06-14 DIAGNOSIS — F411 Generalized anxiety disorder: Secondary | ICD-10-CM

## 2023-06-14 NOTE — Progress Notes (Signed)
[facility] Counselor/Therapist Progress Note  Patient ID: [name], MRN: [id number]    Date: 06/14/23  Time Spent: 1:36  pm - 2:26 pm : 50 Minutes  Treatment Type: Individual Therapy.  Reported Symptoms: worry, feeling on edge, anxiety  Mental Status Exam: Appearance:  Neat and Well Groomed     Behavior: Appropriate  Motor: Normal  Speech/Language:  Clear and Coherent  Affect: Appropriate  Mood: anxious  Thought process: normal  Thought content:   WNL  Sensory/Perceptual disturbances:   WNL  Orientation: oriented to person, place, and situation  Attention: Good  Concentration: Good  Memory: WNL  Fund of knowledge:  Good  Insight:   Good  Judgment:  Good  Impulse Control: Good   Risk Assessment: Danger to Self:  No Patient denied current suicidal ideation  Self-injurious Behavior: No Danger to Others: No Patient denied current homicidal ideation Duty to Warn:no Physical Aggression / Violence:No  Access to Firearms a concern: No  Gang Involvement:No   Subject: Patient stated, "they've been alright" in response to events since last session. Patient stated, "its been ok" in response to patient's mood since last session. Patient stated, "its just the normal everyday stuff" in response to symptoms of anxiety. Patient stated, "I feel like I always feel a little on edge". Patient stated, "one of my biggest goals is if I can gain some confidence I can handle things", "not worry about what other people think". Patient stated, "I do the what ifs", "I can't shut my mind off". Patient reported she experiences intrusive thoughts. Patient stated, "I feel like I have to plan all the time to make things go as smooth as possible". Patient stated, "Id like to just sit there and be sometimes" and reported feeling guilty when she is not planning.    Interventions: Motivational Interviewing. Clinician conducted session in person at clinician's office at [facility].  Reviewed events since last session. Assessed patient's mood. Clinician utilized motivational interviewing to explore potential goals for therapy. Clinician utilized a task centered approach in collaboration with patient to develop goals for therapy. Patient participated in development of goals and agreed to goals for therapy. Provided psycho education related to self care.   Collaboration of Care: Other not required at this time  Diagnosis:  Generalized anxiety disorder   Plan: Patient is to utilize [employer] [facility], thought re-framing, relaxation techniques, and mindfulness to decrease symptoms associated with their diagnosis. Frequency: bi-weekly  Modality: individual     Long-term goal:   Reduce overall level, frequency, and intensity of the feelings of anxiety as evidenced by decreased worry, muscle tension, chest pain, chest tightness, restlessness, irritability, anger, feeling on edge, and negative self talk from 7 days/week to 0 to 1 days/week per patient report for at least 3 consecutive months. Target Date: 06/13/24  Progress: 0   Short-term goal:  Identify, challenge, and replace negative core beliefs, thought patterns, and negative self talk that contribute to feelings of anxiety, low self confidence and low self esteem with positive thoughts, beliefs, and positive self talk per patient's report Target Date: 12/13/23  Progress: 0   Identify, challenge, and replace cognitive distortions with rational and positive beliefs to decrease cognitive distortions, such as, catastrophizing and "what if" thoughts Target Date: 12/13/23  Progress: 0   Implement mindfulness strategies to increase patient's focus on being in the present Target Date: 12/13/23  Progress: 0  Doree Barthel, LCSW

## 2023-07-13 ENCOUNTER — Ambulatory Visit: Payer: 59 | Admitting: Clinical

## 2024-04-17 ENCOUNTER — Other Ambulatory Visit: Payer: Self-pay | Admitting: Obstetrics and Gynecology

## 2024-04-17 DIAGNOSIS — R928 Other abnormal and inconclusive findings on diagnostic imaging of breast: Secondary | ICD-10-CM

## 2024-04-18 ENCOUNTER — Ambulatory Visit
Admission: RE | Admit: 2024-04-18 | Discharge: 2024-04-18 | Disposition: A | Discharge status: Home / Self Care | Source: Ambulatory Visit | Attending: Obstetrics and Gynecology | Admitting: Obstetrics and Gynecology

## 2024-04-18 DIAGNOSIS — R928 Other abnormal and inconclusive findings on diagnostic imaging of breast: Secondary | ICD-10-CM
